# Patient Record
Sex: Female | Born: 1970 | Race: Asian | Hispanic: No | Marital: Married | Smoking: Never smoker
Health system: Southern US, Community
[De-identification: ages and names within clinical notes are randomized; demographics above are authoritative.]

## PROBLEM LIST (undated history)

## (undated) DIAGNOSIS — D649 Anemia, unspecified: Secondary | ICD-10-CM

---

## 1999-09-05 ENCOUNTER — Other Ambulatory Visit: Admission: RE | Admit: 1999-09-05 | Discharge: 1999-09-05 | Payer: Self-pay | Admitting: Gynecology

## 2000-02-03 ENCOUNTER — Encounter: Payer: Self-pay | Admitting: Obstetrics & Gynecology

## 2000-02-03 ENCOUNTER — Ambulatory Visit (HOSPITAL_COMMUNITY): Admission: RE | Admit: 2000-02-03 | Discharge: 2000-02-03 | Payer: Self-pay | Admitting: Obstetrics & Gynecology

## 2000-02-10 ENCOUNTER — Inpatient Hospital Stay (HOSPITAL_COMMUNITY): Admission: AD | Admit: 2000-02-10 | Discharge: 2000-02-12 | Payer: Self-pay | Admitting: Obstetrics and Gynecology

## 2000-02-13 ENCOUNTER — Encounter: Admission: RE | Admit: 2000-02-13 | Discharge: 2000-03-19 | Payer: Self-pay | Admitting: Obstetrics and Gynecology

## 2005-03-05 ENCOUNTER — Other Ambulatory Visit: Admission: RE | Admit: 2005-03-05 | Discharge: 2005-03-05 | Payer: Self-pay | Admitting: Obstetrics & Gynecology

## 2005-03-10 ENCOUNTER — Inpatient Hospital Stay (HOSPITAL_COMMUNITY): Admission: AD | Admit: 2005-03-10 | Discharge: 2005-03-10 | Payer: Self-pay | Admitting: Obstetrics & Gynecology

## 2005-04-02 ENCOUNTER — Ambulatory Visit: Payer: Self-pay | Admitting: Family Medicine

## 2005-04-02 ENCOUNTER — Inpatient Hospital Stay (HOSPITAL_COMMUNITY): Admission: AD | Admit: 2005-04-02 | Discharge: 2005-04-02 | Payer: Self-pay | Admitting: Obstetrics & Gynecology

## 2005-04-15 ENCOUNTER — Ambulatory Visit: Payer: Self-pay | Admitting: Obstetrics & Gynecology

## 2005-04-22 ENCOUNTER — Ambulatory Visit: Payer: Self-pay | Admitting: *Deleted

## 2005-05-13 ENCOUNTER — Ambulatory Visit: Payer: Self-pay | Admitting: *Deleted

## 2005-05-13 ENCOUNTER — Ambulatory Visit (HOSPITAL_COMMUNITY): Admission: RE | Admit: 2005-05-13 | Discharge: 2005-05-13 | Payer: Self-pay | Admitting: Obstetrics & Gynecology

## 2005-05-20 ENCOUNTER — Ambulatory Visit: Payer: Self-pay | Admitting: Obstetrics & Gynecology

## 2005-06-03 ENCOUNTER — Ambulatory Visit: Payer: Self-pay | Admitting: Obstetrics & Gynecology

## 2005-06-24 ENCOUNTER — Ambulatory Visit: Payer: Self-pay | Admitting: *Deleted

## 2005-07-01 ENCOUNTER — Ambulatory Visit: Payer: Self-pay | Admitting: Obstetrics & Gynecology

## 2005-07-02 ENCOUNTER — Observation Stay (HOSPITAL_COMMUNITY): Admission: AD | Admit: 2005-07-02 | Discharge: 2005-07-02 | Payer: Self-pay | Admitting: Obstetrics & Gynecology

## 2005-07-06 ENCOUNTER — Ambulatory Visit: Payer: Self-pay | Admitting: Cardiology

## 2005-07-08 ENCOUNTER — Ambulatory Visit: Payer: Self-pay | Admitting: Cardiology

## 2005-07-08 ENCOUNTER — Ambulatory Visit: Payer: Self-pay | Admitting: *Deleted

## 2005-07-15 ENCOUNTER — Ambulatory Visit: Payer: Self-pay | Admitting: *Deleted

## 2005-07-16 ENCOUNTER — Encounter (HOSPITAL_COMMUNITY): Admission: RE | Admit: 2005-07-16 | Discharge: 2005-08-15 | Payer: Self-pay | Admitting: Obstetrics & Gynecology

## 2005-07-18 ENCOUNTER — Inpatient Hospital Stay (HOSPITAL_COMMUNITY): Admission: AD | Admit: 2005-07-18 | Discharge: 2005-07-18 | Payer: Self-pay | Admitting: Obstetrics and Gynecology

## 2005-07-18 ENCOUNTER — Ambulatory Visit: Payer: Self-pay | Admitting: Family Medicine

## 2005-07-22 ENCOUNTER — Ambulatory Visit (HOSPITAL_COMMUNITY): Admission: RE | Admit: 2005-07-22 | Discharge: 2005-07-22 | Payer: Self-pay | Admitting: Obstetrics & Gynecology

## 2005-07-22 ENCOUNTER — Ambulatory Visit: Payer: Self-pay | Admitting: *Deleted

## 2005-08-02 ENCOUNTER — Inpatient Hospital Stay (HOSPITAL_COMMUNITY): Admission: AD | Admit: 2005-08-02 | Discharge: 2005-08-02 | Payer: Self-pay | Admitting: Family Medicine

## 2005-08-02 ENCOUNTER — Ambulatory Visit: Payer: Self-pay | Admitting: Family Medicine

## 2005-08-05 ENCOUNTER — Ambulatory Visit: Payer: Self-pay | Admitting: *Deleted

## 2005-08-13 ENCOUNTER — Ambulatory Visit: Payer: Self-pay | Admitting: Family Medicine

## 2005-08-14 ENCOUNTER — Ambulatory Visit: Payer: Self-pay | Admitting: Cardiology

## 2005-08-16 ENCOUNTER — Encounter (HOSPITAL_COMMUNITY): Admission: RE | Admit: 2005-08-16 | Discharge: 2005-09-15 | Payer: Self-pay | Admitting: Obstetrics & Gynecology

## 2005-08-19 ENCOUNTER — Ambulatory Visit: Payer: Self-pay | Admitting: Obstetrics & Gynecology

## 2005-09-02 ENCOUNTER — Ambulatory Visit: Payer: Self-pay | Admitting: *Deleted

## 2005-09-08 ENCOUNTER — Inpatient Hospital Stay (HOSPITAL_COMMUNITY): Admission: RE | Admit: 2005-09-08 | Discharge: 2005-09-08 | Payer: Self-pay | Admitting: *Deleted

## 2005-09-08 ENCOUNTER — Ambulatory Visit: Payer: Self-pay | Admitting: Family Medicine

## 2005-09-11 ENCOUNTER — Ambulatory Visit: Payer: Self-pay | Admitting: *Deleted

## 2005-09-16 ENCOUNTER — Ambulatory Visit: Payer: Self-pay | Admitting: Obstetrics & Gynecology

## 2005-09-17 ENCOUNTER — Observation Stay (HOSPITAL_COMMUNITY): Admission: AD | Admit: 2005-09-17 | Discharge: 2005-09-17 | Payer: Self-pay | Admitting: *Deleted

## 2005-09-18 ENCOUNTER — Ambulatory Visit: Payer: Self-pay | Admitting: *Deleted

## 2005-09-22 ENCOUNTER — Ambulatory Visit (HOSPITAL_COMMUNITY): Admission: RE | Admit: 2005-09-22 | Discharge: 2005-09-22 | Payer: Self-pay | Admitting: *Deleted

## 2005-09-23 ENCOUNTER — Ambulatory Visit: Payer: Self-pay | Admitting: Obstetrics & Gynecology

## 2005-09-23 ENCOUNTER — Ambulatory Visit: Payer: Self-pay | Admitting: *Deleted

## 2005-09-23 ENCOUNTER — Inpatient Hospital Stay (HOSPITAL_COMMUNITY): Admission: AD | Admit: 2005-09-23 | Discharge: 2005-09-26 | Payer: Self-pay | Admitting: Obstetrics and Gynecology

## 2006-12-29 IMAGING — US US OB FOLLOW-UP
1 series · 18 of 28 positions shown · non-contrast
Comparison: none

CLINICAL DATA: 28 week 2 day assigned gestational age.  Previous history of IUGR.  Evaluate fetal growth and amniotic fluid.

[Series 1: us ob re-eval · 18 of 52 slices shown]
[im 1/52]
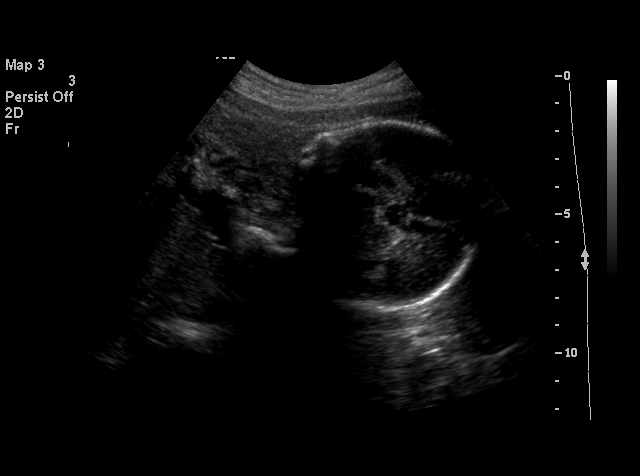
[im 4/52]
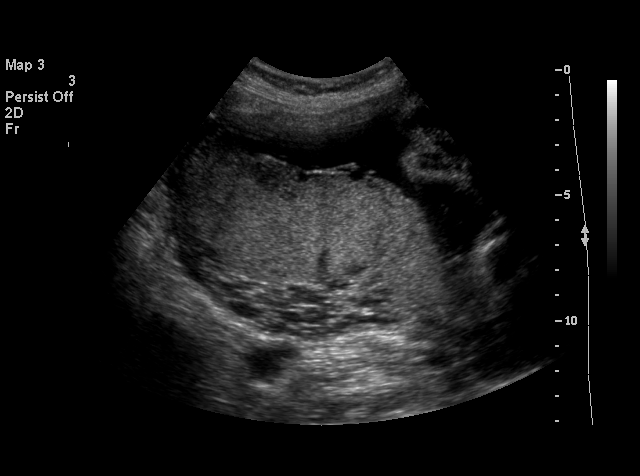
[im 6/52]
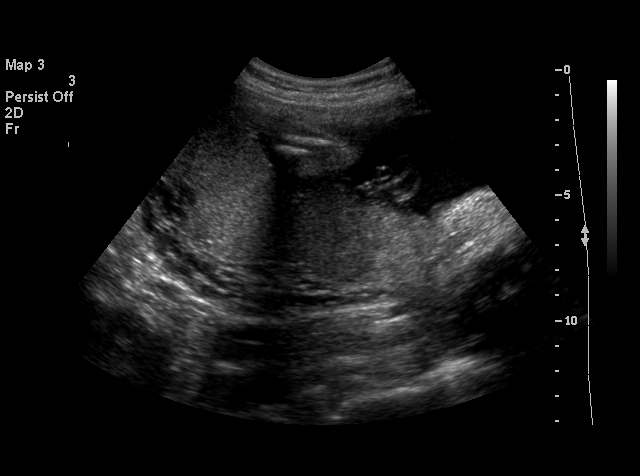
[im 10/52]
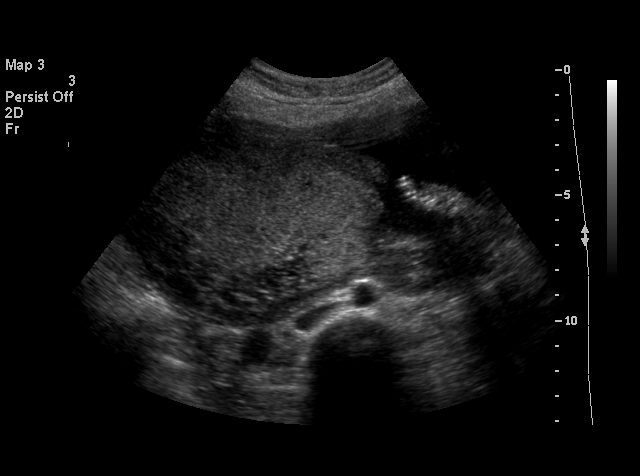
[im 14/52]
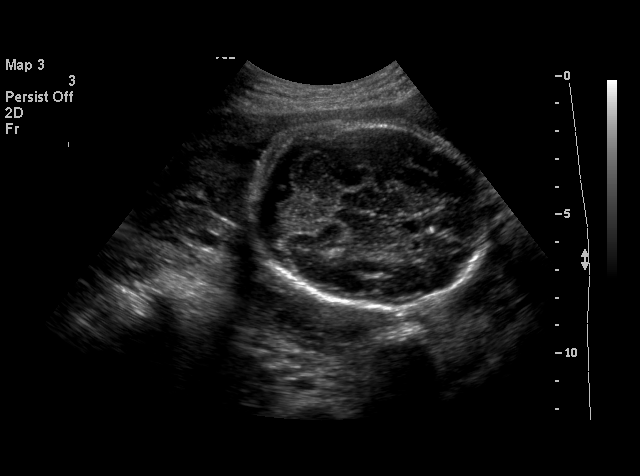
[im 16/52]
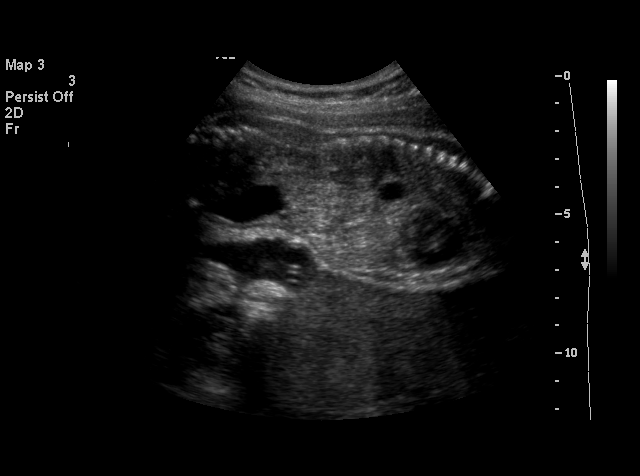
[im 19/52]
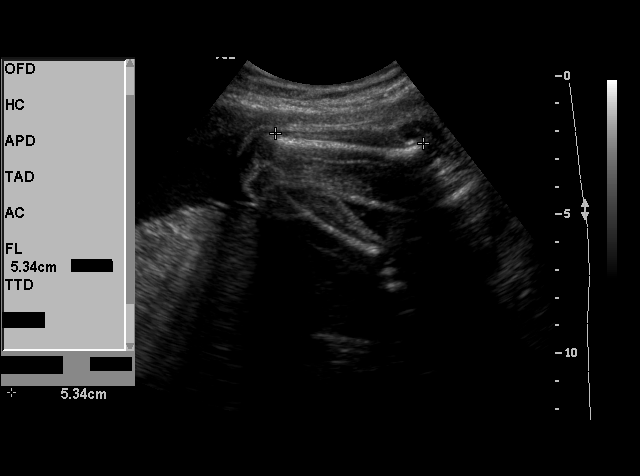
[im 21/52]
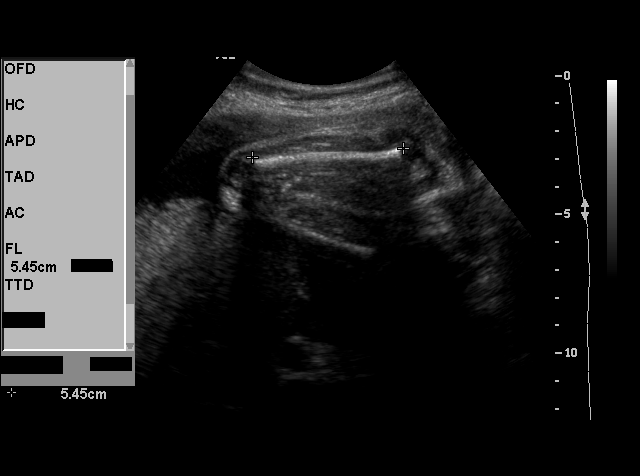
[im 25/52]
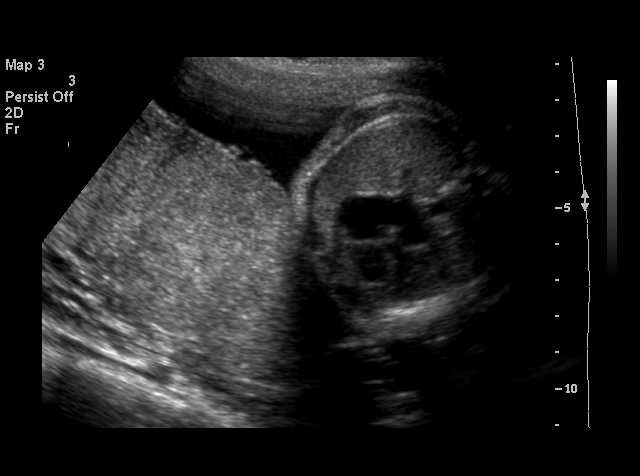
[im 27/52]
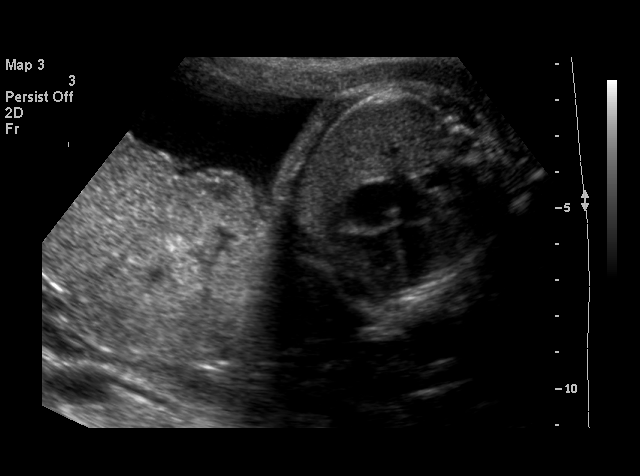
[im 31/52]
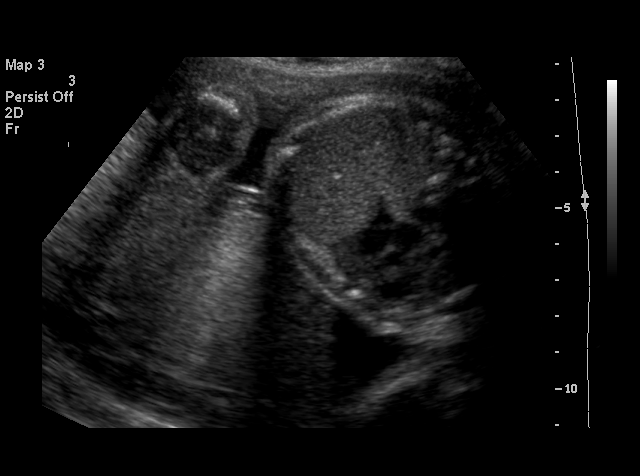
[im 33/52]
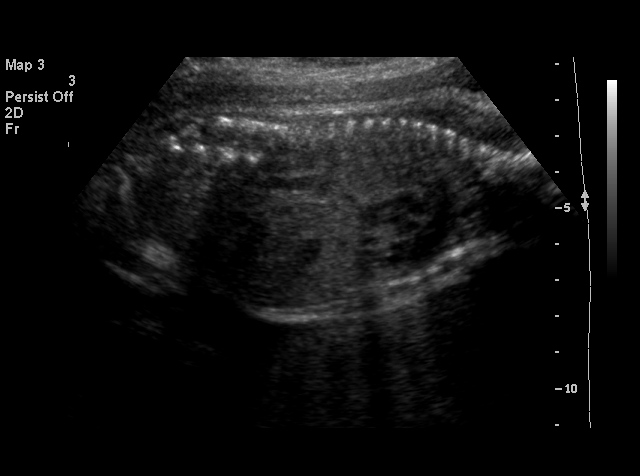
[im 36/52]
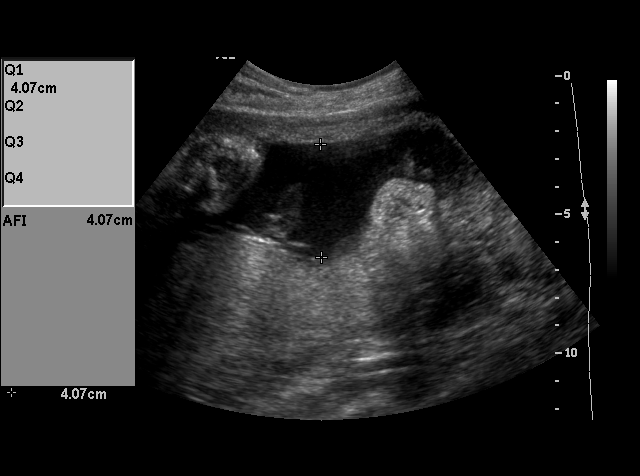
[im 40/52]
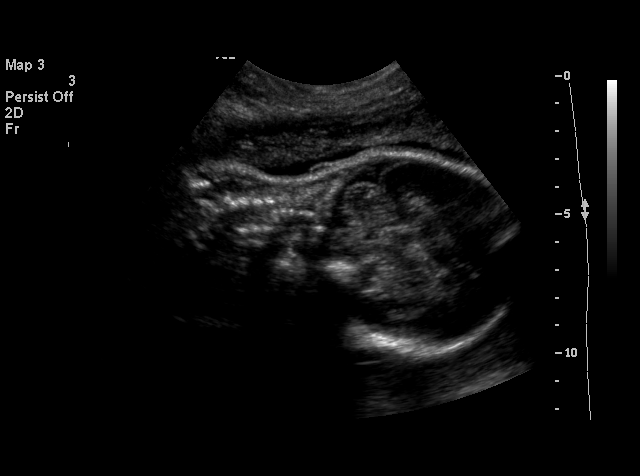
[im 42/52]
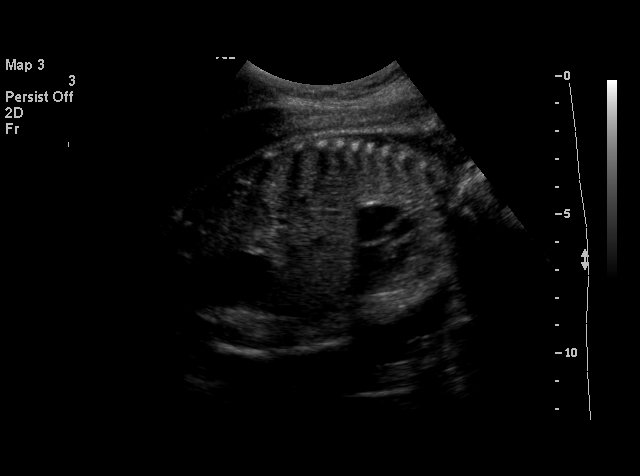
[im 46/52]
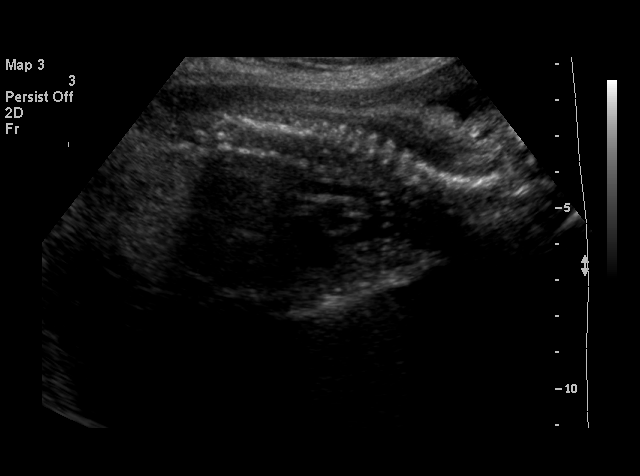
[im 48/52]
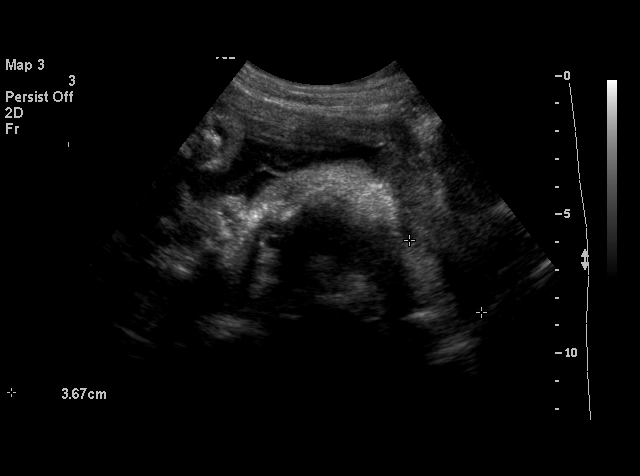
[im 52/52]
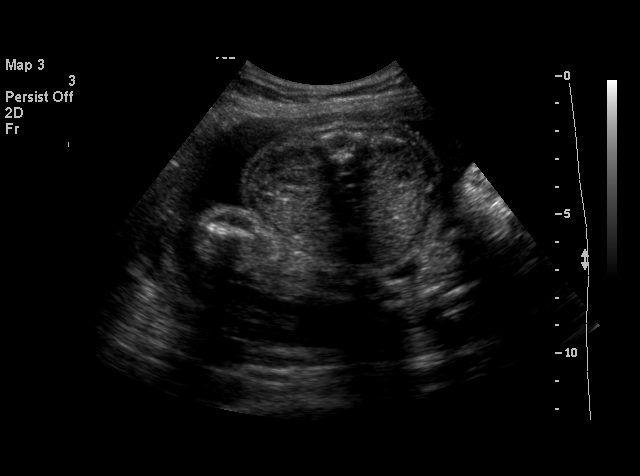

[18 of 28 positions shown; findings below may reference images not displayed]

OBSTETRICAL ULTRASOUND RE-EVALUATION:
Number of Fetuses:  1
Heart Rate:  146
Movement:  Yes
Breathing:  No
Presentation:  Cephalic
Placental Location:  Posterior, right lateral 
Grade:  I
Previa:  No
Amniotic Fluid (subjective):  Normal
Amniotic Fluid (objective):  14.5 cm AFI (5th -95th%ile = 9.4 ? 22.8 cm for 28 weeks)

FETAL BIOMETRY
BPD:  6.7 cm   27 w 0 d
HC:  25.8 cm  28 w 1 d
AC:  22.9 cm   27 w 3 d
FL:  5.3 cm   28 w 3 d

Mean GA:  27 w 6 d
Assigned GA:  28 w 2 d  Assigned EDC:  10/12/05

EFW:  1164 g (H) 50th ? 75th%ile (1110-1162g) for 28 weeks

FETAL ANATOMY
Lateral Ventricles:  Visualized   
Thalami/CSP:  Visualized 
Posterior Fossa:  Visualized 
Nuchal Region:  
Spine:  Previously seen 
4 Chamber Heart on Left:  Visualized 
Stomach on Left:  Visualized 
3 Vessel Cord:  Previously seen      
Cord Insertion Site:  Visualized  
Kidneys:  Visualized 
Bladder:  Visualized      
Extremities:  Previously seen 

ADDITIONAL ANATOMY VISUALIZED:  LVOT, RVOT, upper lip, diaphragm, ductal and aortic arches.
MATERNAL UTERINE AND ADNEXAL FINDINGS
Cervix:  2.9 cm Transabdominally
IMPRESSION: 1.  Assigned gestational age is currently 28 weeks 2 days.  Appropriate fetal growth, with EFW near the 75th percentile.
2.  [place] amniotic fluid volume, with AFI of 14.5 cm.

## 2012-06-03 ENCOUNTER — Encounter (HOSPITAL_COMMUNITY): Payer: Self-pay | Admitting: *Deleted

## 2012-06-03 ENCOUNTER — Emergency Department (HOSPITAL_COMMUNITY): Payer: Medicaid Other

## 2012-06-03 ENCOUNTER — Emergency Department (HOSPITAL_COMMUNITY)
Admission: EM | Admit: 2012-06-03 | Discharge: 2012-06-04 | Disposition: A | Discharge status: Home / Self Care | Payer: Medicaid Other | Attending: Emergency Medicine | Admitting: Emergency Medicine

## 2012-06-03 DIAGNOSIS — R109 Unspecified abdominal pain: Secondary | ICD-10-CM | POA: Insufficient documentation

## 2012-06-03 DIAGNOSIS — R112 Nausea with vomiting, unspecified: Secondary | ICD-10-CM

## 2012-06-03 HISTORY — DX: Anemia, unspecified: D64.9

## 2012-06-03 LAB — CBC WITH DIFFERENTIAL/PLATELET
Basophils Absolute: 0 10*3/uL (ref 0.0–0.1)
Eosinophils Absolute: 0 10*3/uL (ref 0.0–0.7)
Lymphocytes Relative: 22 % (ref 12–46)
MCH: 20.4 pg — ABNORMAL LOW (ref 26.0–34.0)
MCHC: 29.6 g/dL — ABNORMAL LOW (ref 30.0–36.0)
Monocytes Absolute: 0.3 10*3/uL (ref 0.1–1.0)
Neutro Abs: 4.4 10*3/uL (ref 1.7–7.7)
Neutrophils Relative %: 73 % (ref 43–77)
RDW: 18 % — ABNORMAL HIGH (ref 11.5–15.5)

## 2012-06-03 LAB — COMPREHENSIVE METABOLIC PANEL
ALT: 12 U/L (ref 0–35)
Calcium: 9.4 mg/dL (ref 8.4–10.5)
Creatinine, Ser: 0.46 mg/dL — ABNORMAL LOW (ref 0.50–1.10)
GFR calc Af Amer: >90 mL/min (ref >90)
GFR calc non Af Amer: >90 mL/min (ref >90)
Glucose, Bld: 111 mg/dL — ABNORMAL HIGH (ref 70–99)
Sodium: 135 mEq/L (ref 135–145)
Total Protein: 8 g/dL (ref 6.0–8.3)

## 2012-06-03 LAB — LIPASE, BLOOD: Lipase: 21 U/L (ref 11–59)

## 2012-06-03 LAB — POCT I-STAT, CHEM 8
Chloride: 104 mEq/L (ref 96–112)
Glucose, Bld: 113 mg/dL — ABNORMAL HIGH (ref 70–99)
HCT: 32 % — ABNORMAL LOW (ref 36.0–46.0)
Hemoglobin: 10.9 g/dL — ABNORMAL LOW (ref 12.0–15.0)
Potassium: 3.6 mEq/L (ref 3.5–5.1)
Sodium: 139 mEq/L (ref 135–145)

## 2012-06-03 MED ORDER — SODIUM CHLORIDE 0.9 % IV BOLUS (SEPSIS)
1000.0000 mL | Freq: Once | INTRAVENOUS | Status: AC
  Administered 2012-06-03: 1000 mL via INTRAVENOUS

## 2012-06-03 MED ORDER — ONDANSETRON HCL 4 MG/2ML IJ SOLN
4.0000 mg | Freq: Once | INTRAMUSCULAR | Status: AC
  Administered 2012-06-03: 4 mg via INTRAVENOUS
  Filled 2012-06-03: qty 2

## 2012-06-03 NOTE — ED Notes (Signed)
RUE:[id number]<JW> Expected date:<BR> Expected time:<BR> Means of arrival:<BR> Comments:<BR> Triage 1

## 2012-06-03 NOTE — ED Provider Notes (Signed)
History     CSN: [id number]  Arrival date & time 06/03/12  2225   First MD Initiated Contact with Patient 06/03/12 2259      Chief Complaint  Patient presents with  . Emesis    (Consider location/radiation/quality/duration/timing/severity/associated sxs/prior treatment) HPI Hx per PT. Sick since this am with N/V unable to hold anything down, multiple episodes. No diarrhea, no bloo din emesis or stools,. Last BM yesterday, no h/o ABD surgeries, no sick contacts at home. No F/C, no recent travel or known bad food exposures. No medications or medical problems. Mod in severity, has some mild diffuse ABD discomfort, no back pain.  Past Medical History  Diagnosis Date  . Anemia     History reviewed. No pertinent past surgical history.  History reviewed. No pertinent family history.  History  Substance Use Topics  . Smoking status: Never Smoker   . Smokeless tobacco: Not on file  . Alcohol Use: No    OB History    Grav Para Term Preterm Abortions TAB SAB Ect Mult Living                  Review of Systems  Constitutional: Negative for fever and chills.  HENT: Negative for neck pain and neck stiffness.   Eyes: Negative for pain.  Respiratory: Negative for shortness of breath.   Cardiovascular: Negative for chest pain.  Gastrointestinal: Positive for nausea and vomiting. Negative for diarrhea, constipation, blood in stool, abdominal distention and anal bleeding.  Genitourinary: Negative for dysuria.  Musculoskeletal: Negative for back pain.  Skin: Negative for rash.  Neurological: Negative for headaches.  All other systems reviewed and are negative.    Allergies  Review of patient's allergies indicates no known allergies.  Home Medications   Current Outpatient Rx  Name Route Sig Dispense Refill  . ACETAMINOPHEN 500 MG PO TABS Oral Take 500 mg by mouth every 6 (six) hours as needed. Pain    . FERROUS SULFATE 325 (65 FE) MG PO TABS Oral Take 325 mg by mouth daily with  breakfast.      BP 116/67  Pulse 74  Temp 98.3 F (36.8 C) (Oral)  Resp 16  Wt 117 lb (53.071 kg)  SpO2 100%  LMP 06/02/2012  Physical Exam  Constitutional: She is oriented to person, place, and time. She appears well-developed and well-nourished.  HENT:  Head: Normocephalic and atraumatic.  Eyes: Conjunctivae and EOM are normal. Pupils are equal, round, and reactive to light.  Neck: Trachea normal. Neck supple. No thyromegaly present.  Cardiovascular: Normal rate, regular rhythm, S1 normal, S2 normal and normal pulses.     No systolic murmur is present   No diastolic murmur is present  Pulses:      Radial pulses are 2+ on the right side, and 2+ on the left side.  Pulmonary/Chest: Effort normal and breath sounds normal. She has no wheezes. She has no rhonchi. She has no rales. She exhibits no tenderness.  Abdominal: Soft. Normal appearance and bowel sounds are normal. She exhibits no mass. There is no rebound, no guarding, no CVA tenderness and negative Murphy's sign.       Soft throughout with mild diffuse tenderness. No acute ABD  Musculoskeletal:       BLE:s Calves nontender, no cords or erythema, negative Homans sign  Neurological: She is alert and oriented to person, place, and time. She has normal strength. No cranial nerve deficit or sensory deficit. GCS eye subscore is 4. GCS verbal subscore  is 5. GCS motor subscore is 6.  Skin: Skin is warm and dry. No rash noted. She is not diaphoretic.  Psychiatric: Her speech is normal.       Cooperative and appropriate    ED Course  Procedures (including critical care time)  Results for orders placed during the hospital encounter of 06/03/12  CBC WITH DIFFERENTIAL      Component Value Range   WBC 6.0  4.0 - 10.5 K/uL   RBC 4.50  3.87 - 5.11 MIL/uL   Hemoglobin 9.2 (*) 12.0 - 15.0 g/dL   HCT 16.1 (*) 09.6 - 04.5 %   MCV 69.1 (*) 78.0 - 100.0 fL   MCH 20.4 (*) 26.0 - 34.0 pg   MCHC 29.6 (*) 30.0 - 36.0 g/dL   RDW 40.9 (*)  81.1 - 15.5 %   Platelets 325  150 - 400 K/uL   Neutrophils Relative 73  43 - 77 %   Lymphocytes Relative 22  12 - 46 %   Monocytes Relative 5  3 - 12 %   Eosinophils Relative 0  0 - 5 %   Basophils Relative 0  0 - 1 %   Neutro Abs 4.4  1.7 - 7.7 K/uL   Lymphs Abs 1.3  0.7 - 4.0 K/uL   Monocytes Absolute 0.3  0.1 - 1.0 K/uL   Eosinophils Absolute 0.0  0.0 - 0.7 K/uL   Basophils Absolute 0.0  0.0 - 0.1 K/uL   Smear Review MORPHOLOGY UNREMARKABLE    PREGNANCY, URINE      Component Value Range   Preg Test, Ur NEGATIVE  NEGATIVE  COMPREHENSIVE METABOLIC PANEL      Component Value Range   Sodium 135  135 - 145 mEq/L   Potassium 3.6  3.5 - 5.1 mEq/L   Chloride 99  96 - 112 mEq/L   CO2 24  19 - 32 mEq/L   Glucose, Bld 111 (*) 70 - 99 mg/dL   BUN 9  6 - 23 mg/dL   Creatinine, Ser 9.14 (*) 0.50 - 1.10 mg/dL   Calcium 9.4  8.4 - 78.2 mg/dL   Total Protein 8.0  6.0 - 8.3 g/dL   Albumin 4.2  3.5 - 5.2 g/dL   AST 16  0 - 37 U/L   ALT 12  0 - 35 U/L   Alkaline Phosphatase 83  39 - 117 U/L   Total Bilirubin 0.3  0.3 - 1.2 mg/dL   GFR calc non Af Amer >90  >90 mL/min   GFR calc Af Amer >90  >90 mL/min  LIPASE, BLOOD      Component Value Range   Lipase 21  11 - 59 U/L  POCT I-STAT, CHEM 8      Component Value Range   Sodium 139  135 - 145 mEq/L   Potassium 3.6  3.5 - 5.1 mEq/L   Chloride 104  96 - 112 mEq/L   BUN 8  6 - 23 mg/dL   Creatinine, Ser 9.56  0.50 - 1.10 mg/dL   Glucose, Bld 213 (*) 70 - 99 mg/dL   Calcium, Ion 0.86 (*) 1.12 - 1.23 mmol/L   TCO2 22  0 - 100 mmol/L   Hemoglobin 10.9 (*) 12.0 - 15.0 g/dL   HCT 57.8 (*) 46.9 - 62.9 %   Dg Abd Acute W/chest  06/04/2012  *RADIOLOGY REPORT*  Clinical Data: Abdominal pain.  Vomiting.  ACUTE ABDOMEN SERIES (ABDOMEN 2 VIEW & CHEST 1 VIEW)  Comparison:  None.  Findings:  There is no evidence of dilated bowel loops or free intraperitoneal air.  No radiopaque calculi or other significant radiographic abnormality is seen. Heart size and  mediastinal contours are within normal limits.  Both lungs are clear.  IMPRESSION: Negative abdominal radiographs.  No acute cardiopulmonary disease.   Original Report Authenticated By: Danae Orleans, M.D.     IVFs, IV zofran  3:30 AM feeling better, tolerating POs, no tenderness on ABD exam, no peritonitis, requesting to be discharged home, feeling much better.    MDM    VS and nursing notes reviewed. IVFs, zofran, labs and imaging as above. RX and precautions provided - verbalized as understood.         Sunnie Nielsen, MD 06/04/12 [phone]

## 2012-06-03 NOTE — ED Notes (Signed)
Pt cannot urinate. 

## 2012-06-03 NOTE — ED Notes (Signed)
Pt c/o vomiting since this morning more than 20 times. Pt unable to keep water down.

## 2012-06-04 MED ORDER — ONDANSETRON HCL 4 MG PO TABS: 4.0000 mg | ORAL_TABLET | Freq: Four times a day (QID) | ORAL | Status: AC

## 2012-06-04 MED ORDER — PROMETHAZINE HCL 25 MG/ML IJ SOLN
25.0000 mg | Freq: Once | INTRAMUSCULAR | Status: AC
  Administered 2012-06-04: 25 mg via INTRAMUSCULAR
  Filled 2012-06-04: qty 1

## 2012-06-04 NOTE — ED Notes (Signed)
Pt not able to urinate.   

## 2021-01-09 ENCOUNTER — Other Ambulatory Visit: Payer: Self-pay | Admitting: Internal Medicine

## 2021-01-09 DIAGNOSIS — N920 Excessive and frequent menstruation with regular cycle: Secondary | ICD-10-CM

## 2021-01-27 ENCOUNTER — Ambulatory Visit
Admission: RE | Admit: 2021-01-27 | Discharge: 2021-01-27 | Disposition: A | Discharge status: Home / Self Care | Payer: No Typology Code available for payment source | Source: Ambulatory Visit | Attending: Internal Medicine | Admitting: Internal Medicine

## 2021-01-27 DIAGNOSIS — N920 Excessive and frequent menstruation with regular cycle: Secondary | ICD-10-CM
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
## 2021-06-18 ENCOUNTER — Other Ambulatory Visit: Payer: Self-pay

## 2021-06-18 ENCOUNTER — Encounter (HOSPITAL_BASED_OUTPATIENT_CLINIC_OR_DEPARTMENT_OTHER): Payer: Self-pay | Admitting: Obstetrics and Gynecology

## 2021-06-22 DIAGNOSIS — N939 Abnormal uterine and vaginal bleeding, unspecified: Secondary | ICD-10-CM

## 2021-06-22 DIAGNOSIS — D509 Iron deficiency anemia, unspecified: Secondary | ICD-10-CM

## 2021-06-22 DIAGNOSIS — N840 Polyp of corpus uteri: Secondary | ICD-10-CM

## 2021-06-24 ENCOUNTER — Encounter (HOSPITAL_BASED_OUTPATIENT_CLINIC_OR_DEPARTMENT_OTHER)
Admission: RE | Admit: 2021-06-24 | Discharge: 2021-06-24 | Disposition: A | Discharge status: Home / Self Care | Payer: PRIVATE HEALTH INSURANCE | Source: Ambulatory Visit | Attending: Obstetrics and Gynecology | Admitting: Obstetrics and Gynecology

## 2021-06-24 DIAGNOSIS — N939 Abnormal uterine and vaginal bleeding, unspecified: Secondary | ICD-10-CM | POA: Insufficient documentation

## 2021-06-24 DIAGNOSIS — N841 Polyp of cervix uteri: Secondary | ICD-10-CM | POA: Diagnosis not present

## 2021-06-24 DIAGNOSIS — D500 Iron deficiency anemia secondary to blood loss (chronic): Secondary | ICD-10-CM | POA: Diagnosis not present

## 2021-06-24 DIAGNOSIS — Z01818 Encounter for other preprocedural examination: Secondary | ICD-10-CM | POA: Insufficient documentation

## 2021-06-24 LAB — TYPE AND SCREEN
ABO/RH(D): B POS
Antibody Screen: NEGATIVE

## 2021-06-24 LAB — CBC
HCT: 32.9 % — ABNORMAL LOW (ref 36.0–46.0)
Hemoglobin: 9.8 g/dL — ABNORMAL LOW (ref 12.0–15.0)
MCH: 21.6 pg — ABNORMAL LOW (ref 26.0–34.0)
MCHC: 29.8 g/dL — ABNORMAL LOW (ref 30.0–36.0)
MCV: 72.5 fL — ABNORMAL LOW (ref 80.0–100.0)
Platelets: 252 10*3/uL (ref 150–400)
RBC: 4.54 MIL/uL (ref 3.87–5.11)
RDW: 16.8 % — ABNORMAL HIGH (ref 11.5–15.5)
WBC: 5 10*3/uL (ref 4.0–10.5)
nRBC: 0 % (ref 0.0–0.2)

## 2021-06-24 NOTE — Progress Notes (Signed)

## 2021-06-24 NOTE — Progress Notes (Signed)
Sent text reminding pt to come for lab work today.  

## 2021-06-25 ENCOUNTER — Ambulatory Visit (HOSPITAL_BASED_OUTPATIENT_CLINIC_OR_DEPARTMENT_OTHER): Payer: PRIVATE HEALTH INSURANCE | Admitting: Anesthesiology

## 2021-06-25 ENCOUNTER — Ambulatory Visit (HOSPITAL_BASED_OUTPATIENT_CLINIC_OR_DEPARTMENT_OTHER)
Admission: RE | Admit: 2021-06-25 | Discharge: 2021-06-25 | Disposition: A | Discharge status: Home / Self Care | Payer: PRIVATE HEALTH INSURANCE | Attending: Obstetrics and Gynecology | Admitting: Obstetrics and Gynecology

## 2021-06-25 ENCOUNTER — Encounter (HOSPITAL_BASED_OUTPATIENT_CLINIC_OR_DEPARTMENT_OTHER)
Admission: RE | Disposition: A | Discharge status: Home / Self Care | Payer: Self-pay | Source: Home / Self Care | Attending: Obstetrics and Gynecology

## 2021-06-25 ENCOUNTER — Encounter (HOSPITAL_BASED_OUTPATIENT_CLINIC_OR_DEPARTMENT_OTHER): Payer: Self-pay | Admitting: Obstetrics and Gynecology

## 2021-06-25 ENCOUNTER — Other Ambulatory Visit: Payer: Self-pay

## 2021-06-25 DIAGNOSIS — D509 Iron deficiency anemia, unspecified: Secondary | ICD-10-CM

## 2021-06-25 DIAGNOSIS — D500 Iron deficiency anemia secondary to blood loss (chronic): Secondary | ICD-10-CM | POA: Insufficient documentation

## 2021-06-25 DIAGNOSIS — N939 Abnormal uterine and vaginal bleeding, unspecified: Secondary | ICD-10-CM | POA: Diagnosis not present

## 2021-06-25 DIAGNOSIS — N840 Polyp of corpus uteri: Secondary | ICD-10-CM

## 2021-06-25 DIAGNOSIS — N841 Polyp of cervix uteri: Secondary | ICD-10-CM | POA: Insufficient documentation

## 2021-06-25 HISTORY — PX: HYSTEROSCOPY WITH D & C: SHX1775

## 2021-06-25 HISTORY — PX: HYSTEROSCOPY: SHX211

## 2021-06-25 LAB — POCT PREGNANCY, URINE: Preg Test, Ur: NEGATIVE

## 2021-06-25 SURGERY — DILATATION AND CURETTAGE /HYSTEROSCOPY
Anesthesia: General | Site: Uterus

## 2021-06-25 MED ORDER — OXYCODONE HCL 5 MG PO TABS: 5.0000 mg | ORAL_TABLET | Freq: Four times a day (QID) | ORAL | 0 refills | Status: DC | PRN

## 2021-06-25 MED ORDER — SODIUM CHLORIDE 0.9 % IR SOLN
Status: DC | PRN
  Administered 2021-06-25 (×2): 3000 mL

## 2021-06-25 MED ORDER — DEXAMETHASONE SODIUM PHOSPHATE 4 MG/ML IJ SOLN
INTRAMUSCULAR | Status: DC | PRN
  Administered 2021-06-25: 10 mg via INTRAVENOUS

## 2021-06-25 MED ORDER — FENTANYL CITRATE (PF) 100 MCG/2ML IJ SOLN: 25.0000 ug | INTRAMUSCULAR | Status: DC | PRN

## 2021-06-25 MED ORDER — OXYCODONE HCL 5 MG PO TABS
ORAL_TABLET | ORAL | Status: AC
  Filled 2021-06-25: qty 1

## 2021-06-25 MED ORDER — DROPERIDOL 2.5 MG/ML IJ SOLN: 0.6250 mg | Freq: Once | INTRAMUSCULAR | Status: DC | PRN

## 2021-06-25 MED ORDER — ONDANSETRON HCL 4 MG/2ML IJ SOLN: 4.0000 mg | Freq: Once | INTRAMUSCULAR | Status: DC | PRN

## 2021-06-25 MED ORDER — KETOROLAC TROMETHAMINE 30 MG/ML IJ SOLN
INTRAMUSCULAR | Status: AC
  Filled 2021-06-25: qty 1

## 2021-06-25 MED ORDER — LACTATED RINGERS IV SOLN: INTRAVENOUS | Status: DC

## 2021-06-25 MED ORDER — SILVER NITRATE-POT NITRATE 75-25 % EX MISC
CUTANEOUS | Status: DC | PRN
  Administered 2021-06-25: 1
  Administered 2021-06-25: 2

## 2021-06-25 MED ORDER — LIDOCAINE HCL (CARDIAC) PF 100 MG/5ML IV SOSY
PREFILLED_SYRINGE | INTRAVENOUS | Status: DC | PRN
  Administered 2021-06-25: 40 mg via INTRAVENOUS

## 2021-06-25 MED ORDER — OXYCODONE HCL 5 MG/5ML PO SOLN
5.0000 mg | Freq: Once | ORAL | Status: AC | PRN
Start: 2021-06-25 — End: 2021-06-25

## 2021-06-25 MED ORDER — FENTANYL CITRATE (PF) 100 MCG/2ML IJ SOLN
INTRAMUSCULAR | Status: DC | PRN
  Administered 2021-06-25 (×2): 25 ug via INTRAVENOUS
  Administered 2021-06-25: 50 ug via INTRAVENOUS

## 2021-06-25 MED ORDER — OXYCODONE HCL 5 MG PO TABS
5.0000 mg | ORAL_TABLET | Freq: Once | ORAL | Status: AC | PRN
  Administered 2021-06-25: 5 mg via ORAL

## 2021-06-25 MED ORDER — PROPOFOL 10 MG/ML IV BOLUS
INTRAVENOUS | Status: AC
  Filled 2021-06-25: qty 20

## 2021-06-25 MED ORDER — IBUPROFEN 800 MG PO TABS: 800.0000 mg | ORAL_TABLET | Freq: Three times a day (TID) | ORAL | 0 refills | Status: DC | PRN

## 2021-06-25 MED ORDER — FENTANYL CITRATE (PF) 100 MCG/2ML IJ SOLN
INTRAMUSCULAR | Status: AC
  Filled 2021-06-25: qty 2

## 2021-06-25 MED ORDER — ONDANSETRON HCL 4 MG/2ML IJ SOLN
INTRAMUSCULAR | Status: DC | PRN
  Administered 2021-06-25: 4 mg via INTRAVENOUS

## 2021-06-25 MED ORDER — DEXAMETHASONE SODIUM PHOSPHATE 10 MG/ML IJ SOLN
INTRAMUSCULAR | Status: AC
  Filled 2021-06-25: qty 1

## 2021-06-25 MED ORDER — PROPOFOL 10 MG/ML IV BOLUS
INTRAVENOUS | Status: DC | PRN
  Administered 2021-06-25: 30 mg via INTRAVENOUS
  Administered 2021-06-25: 120 mg via INTRAVENOUS

## 2021-06-25 MED ORDER — ONDANSETRON HCL 4 MG/2ML IJ SOLN
INTRAMUSCULAR | Status: AC
  Filled 2021-06-25: qty 2

## 2021-06-25 MED ORDER — KETOROLAC TROMETHAMINE 30 MG/ML IJ SOLN
INTRAMUSCULAR | Status: DC | PRN
  Administered 2021-06-25: 30 mg via INTRAVENOUS

## 2021-06-25 MED ORDER — MIDAZOLAM HCL 5 MG/5ML IJ SOLN
INTRAMUSCULAR | Status: DC | PRN
  Administered 2021-06-25: 2 mg via INTRAVENOUS

## 2021-06-25 MED ORDER — MIDAZOLAM HCL 2 MG/2ML IJ SOLN
INTRAMUSCULAR | Status: AC
  Filled 2021-06-25: qty 2

## 2021-06-25 SURGICAL SUPPLY — 23 items
CANISTER SUCT 1200ML W/VALVE (MISCELLANEOUS) ×3 IMPLANT
CATH ROBINSON RED A/P 16FR (CATHETERS) ×1 IMPLANT
DEVICE MYOSURE LITE (MISCELLANEOUS) IMPLANT
DEVICE MYOSURE REACH (MISCELLANEOUS) IMPLANT
DILATOR CANAL MILEX (MISCELLANEOUS) IMPLANT
ELECT REM PT RETURN 9FT ADLT (ELECTROSURGICAL)
ELECTRODE REM PT RTRN 9FT ADLT (ELECTROSURGICAL) IMPLANT
GAUZE 4X4 16PLY ~~LOC~~+RFID DBL (SPONGE) ×3 IMPLANT
GLOVE SURG ENC MOIS LTX SZ6.5 (GLOVE) ×6 IMPLANT
GLOVE SURG ENC TEXT LTX SZ6.5 (GLOVE) ×3 IMPLANT
GLOVE SURG UNDER POLY LF SZ6.5 (GLOVE) ×3 IMPLANT
GLOVE SURG UNDER POLY LF SZ7 (GLOVE) ×5 IMPLANT
GOWN STRL REUS W/ TWL LRG LVL3 (GOWN DISPOSABLE) ×5 IMPLANT
GOWN STRL REUS W/TWL LRG LVL3 (GOWN DISPOSABLE) ×9
KIT PROCEDURE FLUENT (KITS) ×3 IMPLANT
KIT TURNOVER KIT B (KITS) ×3 IMPLANT
PACK VAGINAL MINOR WOMEN LF (CUSTOM PROCEDURE TRAY) ×3 IMPLANT
PAD OB MATERNITY 4.3X12.25 (PERSONAL CARE ITEMS) ×3 IMPLANT
SEAL ROD LENS SCOPE MYOSURE (ABLATOR) ×3 IMPLANT
SET GENESYS HTA PROCERVA (MISCELLANEOUS) ×2 IMPLANT
SLEEVE SCD COMPRESS KNEE MED (STOCKING) ×3 IMPLANT
TOWEL GREEN STERILE FF (TOWEL DISPOSABLE) ×6 IMPLANT
UNDERPAD 30X36 HEAVY ABSORB (UNDERPADS AND DIAPERS) ×3 IMPLANT

## 2021-06-25 NOTE — Transfer of Care (Signed)
Immediate Anesthesia Transfer of Care Note  Patient: Diana Patrick  Procedure(s) Performed: DILATATION & CURETTAGE/HYSTEROSCOPY WITH  POLPYECTOMY (Uterus) HYDROTHERMAL ABLATION (Uterus)  Patient Location: PACU  Anesthesia Type:General  Level of Consciousness: sedated  Airway & Oxygen Therapy: Patient Spontanous Breathing and Patient connected to face mask oxygen  Post-op Assessment: Report given to RN and Post -op Vital signs reviewed and stable  Post vital signs: Reviewed and stable  Last Vitals:  Vitals Value Taken Time  BP    Temp    Pulse 70 06/25/21 1550  Resp    SpO2 100 % 06/25/21 1550  Vitals shown include unvalidated device data.  Last Pain:  Vitals:   06/25/21 1151  TempSrc: Oral  PainSc: 0-No pain      Patients Stated Pain Goal: 8 (06/25/21 1151)  Complications: No notable events documented.

## 2021-06-25 NOTE — Interval H&P Note (Signed)
History and Physical Interval Note:  06/25/2021 1:09 PM  Diana Patrick  has presented today for surgery, with the diagnosis of Endometrial Polyp.  The various methods of treatment have been discussed with the patient and family. After consideration of risks, benefits and other options for treatment, the patient has consented to  Procedure(s): DILATATION & CURETTAGE/HYSTEROSCOPY WITH MYOSURE, POLPYECTOMY (N/A) HYDROTHERMAL ABLATION (N/A) as a surgical intervention. Patient understands danger of pregnancy after an endometrial ablation. Desires to use condoms only for contraception. The patient's history has been reviewed, patient examined, no change in status, stable for surgery.  I have reviewed the patient's chart and labs.  Questions were answered to the patient's satisfaction.     Steva Ready

## 2021-06-25 NOTE — Discharge Instructions (Signed)
Motrin/Ibuprofen can be taken at 930pm tonight if needed.   Post Anesthesia Home Care Instructions  Activity: Get plenty of rest for the remainder of the day. A responsible individual must stay with you for 24 hours following the procedure.  For the next 24 hours, DO NOT: -Drive a car -Advertising copywriter -Drink alcoholic beverages -Take any medication unless instructed by your physician -Make any legal decisions or sign important papers.  Meals: Start with liquid foods such as gelatin or soup. Progress to regular foods as tolerated. Avoid greasy, spicy, heavy foods. If nausea and/or vomiting occur, drink only clear liquids until the nausea and/or vomiting subsides. Call your physician if vomiting continues.  Special Instructions/Symptoms: Your throat may feel dry or sore from the anesthesia or the breathing tube placed in your throat during surgery. If this causes discomfort, gargle with warm salt water. The discomfort should disappear within 24 hours.  If you had a scopolamine patch placed behind your ear for the management of post- operative nausea and/or vomiting:  1. The medication in the patch is effective for 72 hours, after which it should be removed.  Wrap patch in a tissue and discard in the trash. Wash hands thoroughly with soap and water. 2. You may remove the patch earlier than 72 hours if you experience unpleasant side effects which may include dry mouth, dizziness or visual disturbances. 3. Avoid touching the patch. Wash your hands with soap and water after contact with the patch.

## 2021-06-25 NOTE — Anesthesia Preprocedure Evaluation (Signed)
Anesthesia Evaluation  Patient identified by MRN, date of birth, ID band Patient awake    Reviewed: Allergy & Precautions, NPO status , Patient's Chart, lab work & pertinent test results  Airway Mallampati: II  TM Distance: >3 FB Neck ROM: Full    Dental no notable dental hx. (+) Teeth Intact, Dental Advisory Given   Pulmonary neg pulmonary ROS,    Pulmonary exam normal breath sounds clear to auscultation       Cardiovascular negative cardio ROS Normal cardiovascular exam Rhythm:Regular Rate:Normal     Neuro/Psych negative neurological ROS  negative psych ROS   GI/Hepatic negative GI ROS, Neg liver ROS,   Endo/Other  negative endocrine ROS  Renal/GU negative Renal ROS  negative genitourinary   Musculoskeletal negative musculoskeletal ROS (+)   Abdominal   Peds  Hematology  (+) anemia ,   Anesthesia Other Findings   Reproductive/Obstetrics Endometrial polyp AUB                             Anesthesia Physical Anesthesia Plan  ASA: 2  Anesthesia Plan: General   Post-op Pain Management:    Induction: Intravenous  PONV Risk Score and Plan: Midazolam, Treatment may vary due to age or medical condition, Ondansetron and Dexamethasone  Airway Management Planned: LMA  Additional Equipment:   Intra-op Plan:   Post-operative Plan: Extubation in OR  Informed Consent: I have reviewed the patients History and Physical, chart, labs and discussed the procedure including the risks, benefits and alternatives for the proposed anesthesia with the patient or authorized representative who has indicated his/her understanding and acceptance.     Dental advisory given  Plan Discussed with: CRNA and Anesthesiologist  Anesthesia Plan Comments:         Anesthesia Quick Evaluation

## 2021-06-25 NOTE — Anesthesia Postprocedure Evaluation (Signed)
Anesthesia Post Note  Patient: Shrita Thien  Procedure(s) Performed: DILATATION & CURETTAGE/HYSTEROSCOPY WITH  POLPYECTOMY (Uterus) HYDROTHERMAL ABLATION (Uterus)     Patient location during evaluation: PACU Anesthesia Type: General Level of consciousness: awake and alert and oriented Pain management: pain level controlled Vital Signs Assessment: post-procedure vital signs reviewed and stable Respiratory status: spontaneous breathing, nonlabored ventilation and respiratory function stable Cardiovascular status: blood pressure returned to baseline and stable Postop Assessment: no apparent nausea or vomiting Anesthetic complications: no   No notable events documented.  Last Vitals:  Vitals:   06/25/21 1555 06/25/21 1556  BP: (!) 148/85   Pulse: 61 72  Resp: 16 18  Temp:  (!) 36.4 C  SpO2: 100% 99%    Last Pain:  Vitals:   06/25/21 1555  TempSrc:   PainSc: 0-No pain                 Ike Maragh A.

## 2021-06-25 NOTE — Op Note (Signed)
Pre Op Dx:   1. Abnormal uterine bleeding 2. Endometrial polyp on EMB 3. Iron deficiency anemia due to chronic blood loss  Post Op Dx:   1. Abnormal uterine bleeding 2. Cervical polyp 3. Iron deficiency anemia due to chronic blood loss  Procedure:   1. Hysteroscopy with Dilation and Curettage 2. Hydrothermal Ablation   Surgeon:  Dr. Steva Ready Assistants:  None Anesthesia:  General   EBL:  Minimal  IVF:  See anesthesia documentation UOP:  See documentation Fluid Deficit:  100cc   Drains:  None Specimen removed:  Endometrial curettings and cervical polyp - sent to pathology Device(s) implanted: None Case Type:  Clean-contaminated Findings:  Normal-appearing cervix. Endometrium normal-appearing with bilateral tubal ostia visualized. Small cervical polyp present. Complications: None Indications:  50 y.o. G2P2 with abnormal uterine bleeding and iron deficiency anemia due to chronic blood loss who desired hydrothermal ablation for management of bleeding.  Description of each procedure: After informed consent was obtained, the patient was taken to the operating room in the dorsal supine position. After administration of general endotracheal anesthesia, the patient was placed in the dorsal lithotomy position and was prepped and draped in the usual sterile fashion. The anterior lip of the cervix was grasped with a single-tooth tenaculum   The uterus sounded to 7cm. The cervix was serially dilated to accommodate the hysteroscope. The findings as above were noted. A gentle endometrial and cervical curettage performed prior to hydrothermal ablation. The findings as above were noted. The hydrothermal hysteroscope was then advanced into the uterus. Prior to hydrothermal ablation, adequate seal and absence of uterine perforation was noted. The hydrothermal ablation was performed for 10 minutes and adequate cool down performed at the end of the procedure. The tenaculum sites were treated with  silver nitrate.  Excellent hemostasis noted. All sponge and needle counts were correct x 2.  The patient was awakened, extubated, and appeared to have tolerated the procedure well. Disposition:  PACU  Steva Ready, DO

## 2021-06-25 NOTE — Anesthesia Procedure Notes (Signed)
Procedure Name: LMA Insertion Date/Time: 06/25/2021 2:46 PM Performed by: Ronnette Hila, CRNA Pre-anesthesia Checklist: Patient identified, Emergency Drugs available, Suction available and Patient being monitored Patient Re-evaluated:Patient Re-evaluated prior to induction Oxygen Delivery Method: Circle system utilized Preoxygenation: Pre-oxygenation with 100% oxygen Induction Type: IV induction Ventilation: Mask ventilation without difficulty LMA: LMA inserted LMA Size: 3.0 Number of attempts: 1 Airway Equipment and Method: Bite block Placement Confirmation: positive ETCO2 Tube secured with: Tape Dental Injury: Teeth and Oropharynx as per pre-operative assessment

## 2021-06-26 ENCOUNTER — Encounter (HOSPITAL_BASED_OUTPATIENT_CLINIC_OR_DEPARTMENT_OTHER): Payer: Self-pay | Admitting: Obstetrics and Gynecology

## 2021-06-27 LAB — SURGICAL PATHOLOGY

## 2021-06-27 NOTE — Addendum Note (Signed)
Addendum  created 06/27/21 0953 by Alford Highland, CRNA   Charge Capture section accepted

## 2022-07-06 IMAGING — US US PELVIS COMPLETE WITH TRANSVAGINAL
1 series · 13 of 25 positions shown · non-contrast
Comparison: None

CLINICAL DATA: Menorrhagia with regular cycles

EXAM:
TRANSABDOMINAL AND TRANSVAGINAL ULTRASOUND OF PELVIS
TECHNIQUE: Both transabdominal and transvaginal ultrasound examinations of the
pelvis were performed. Transabdominal technique was performed for
global imaging of the pelvis including uterus, ovaries, adnexal
regions, and pelvic cul-de-sac. It was necessary to proceed with
endovaginal exam following the transabdominal exam to visualize the
uterus, endometrium and ovaries.

[Series 1: us pelvis complete with transvaginal · 0.22mm/px · 13 of 47 slices shown]
[im 1/47]
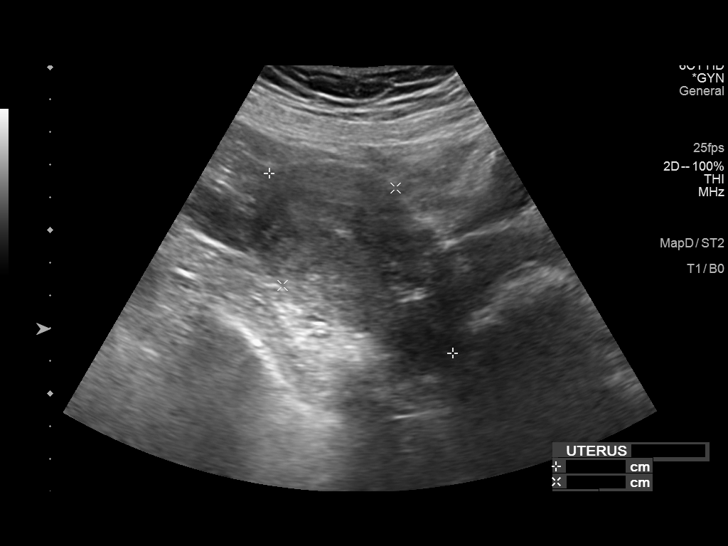
[im 4/47]
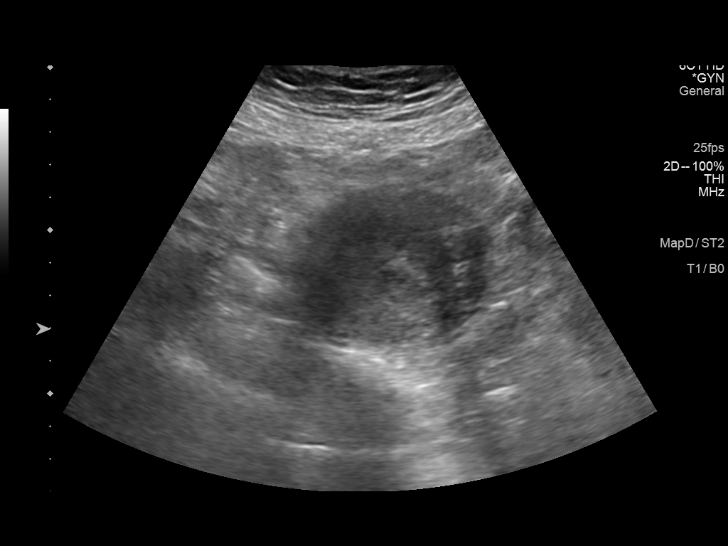
[im 8/47]
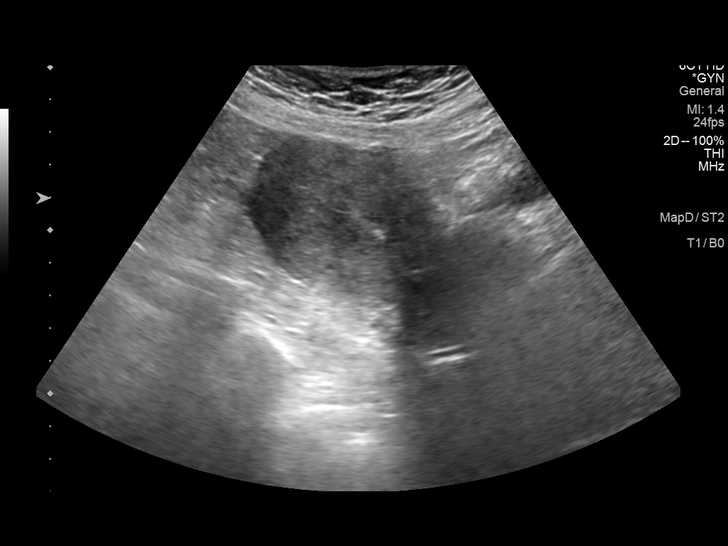
[im 12/47]
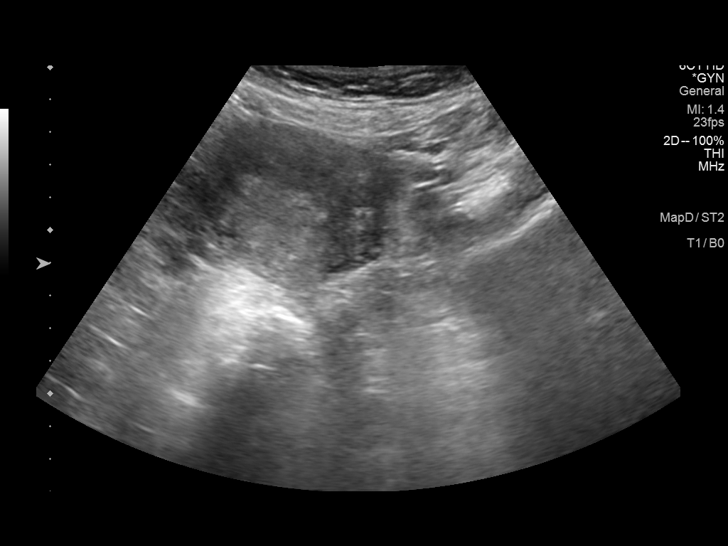
[im 16/47]
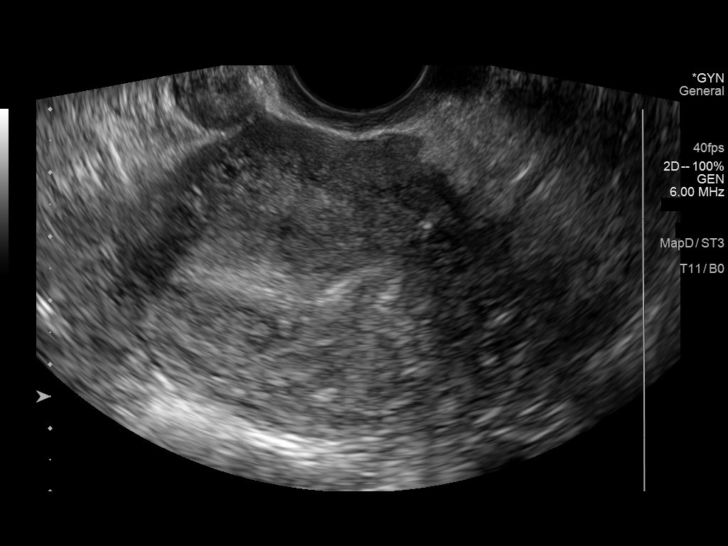
[im 20/47]
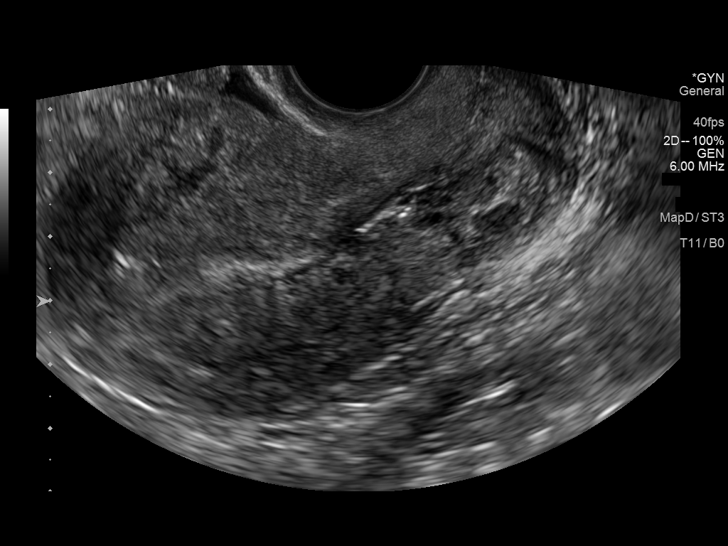
[im 24/47]
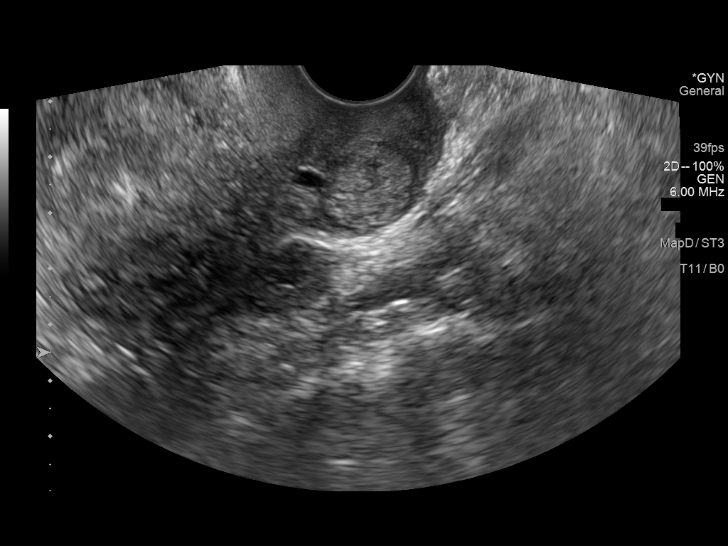
[im 27/47]
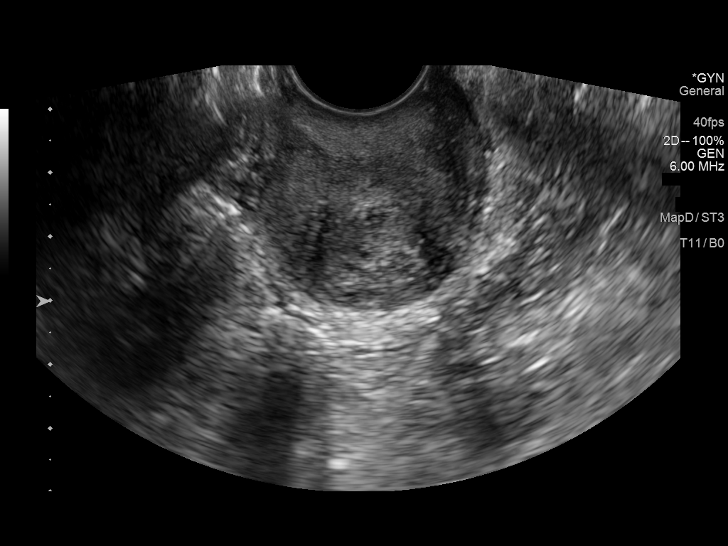
[im 31/47]
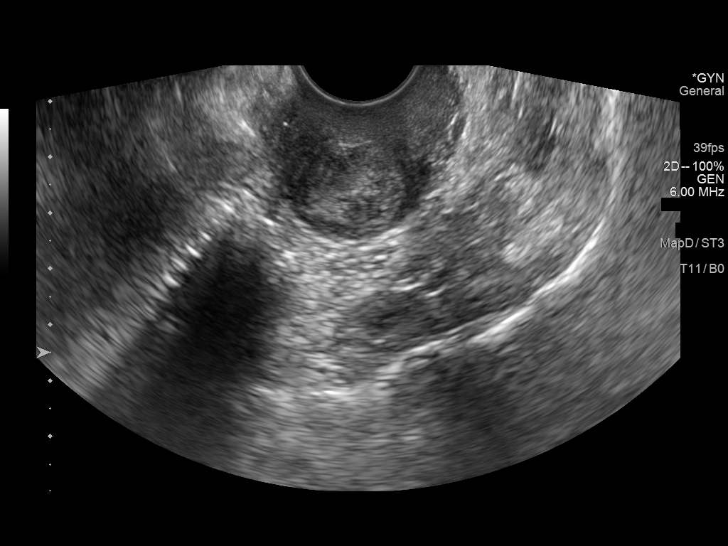
[im 35/47]
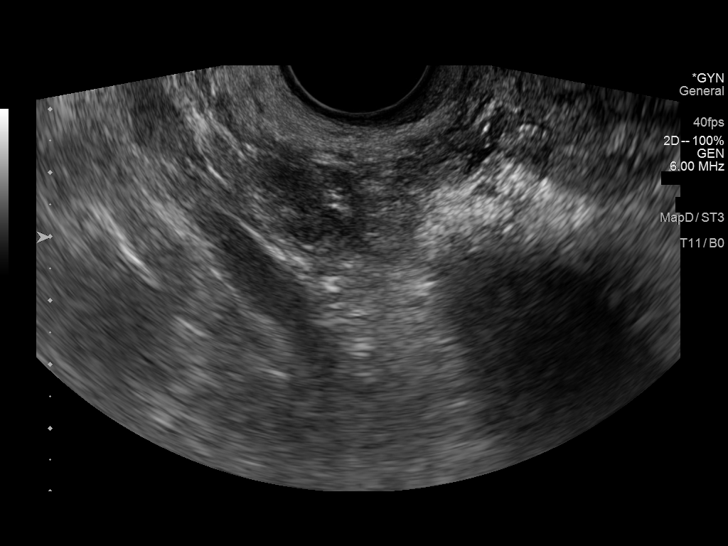
[im 39/47]
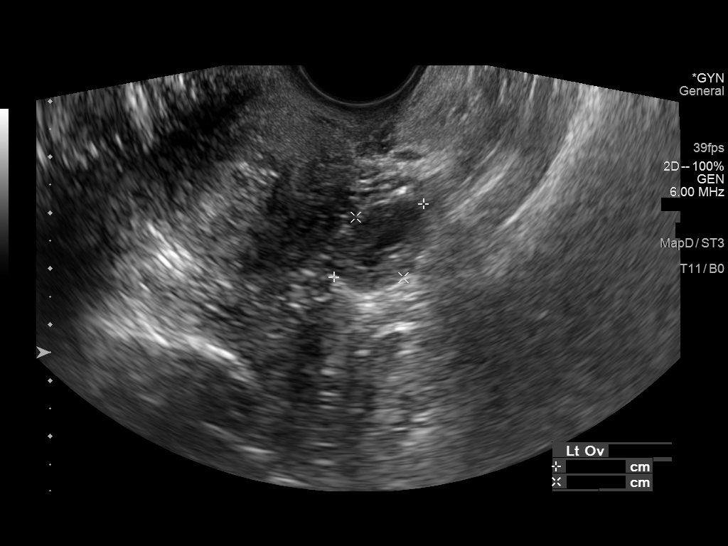
[im 43/47]
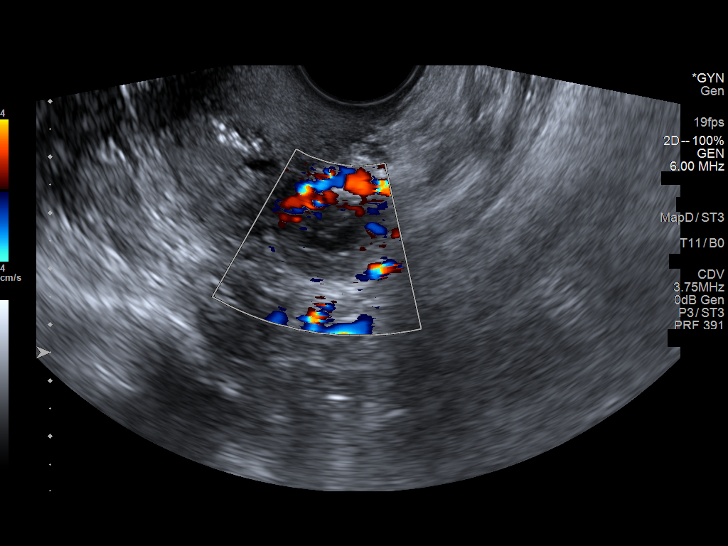
[im 47/47]
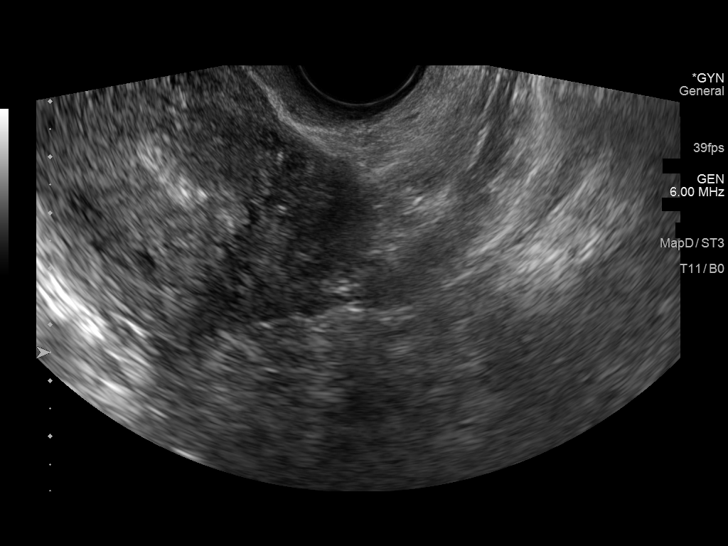

[13 of 25 positions shown; findings below may reference images not displayed]

FINDINGS: Uterus

Measurements: 7.9 x 4.6 x 5.7 cm (volume = 110 cm^3). Within the
posterior wall of the cervix there is a round, well-circumscribed
complex heterogeneous echogenic structure measuring 1.8 x 1.5 x
cm.

Endometrium

Thickness: 8.3 mm.  No focal abnormality visualized.

Right ovary

Measurements: 2.1 x 1.5 x 1.1 cm = volume: 1.9 mL. Normal
appearance/no adnexal mass.

Left ovary

Measurements: 2.1 x 1.5 x 1.9 cm = volume: 2.9 mL. Normal
appearance/no adnexal mass.

Other findings

Trace free fluid noted within the pelvis.
IMPRESSION: 1. Within the posterior wall of the cervix there is a 1.9 cm,
well-circumscribed, round complex echogenic structure. Differential
considerations include complex nabothian cyst, cervical leiomyoma,
polyp, or cervical carcinoma. Recommend more definitive
characterization with contrast enhanced MRI of the pelvis.

## 2022-10-26 ENCOUNTER — Other Ambulatory Visit: Payer: Self-pay | Admitting: Family Medicine

## 2022-10-26 ENCOUNTER — Ambulatory Visit
Admission: RE | Admit: 2022-10-26 | Discharge: 2022-10-26 | Disposition: A | Discharge status: Home / Self Care | Payer: PRIVATE HEALTH INSURANCE | Source: Ambulatory Visit | Attending: Family Medicine | Admitting: Family Medicine

## 2022-10-26 DIAGNOSIS — R059 Cough, unspecified: Secondary | ICD-10-CM

## 2022-10-26 DIAGNOSIS — R0789 Other chest pain: Secondary | ICD-10-CM

## 2022-11-19 ENCOUNTER — Other Ambulatory Visit: Payer: Self-pay | Admitting: Family Medicine

## 2022-11-19 ENCOUNTER — Ambulatory Visit
Admission: RE | Admit: 2022-11-19 | Discharge: 2022-11-19 | Disposition: A | Discharge status: Home / Self Care | Payer: PRIVATE HEALTH INSURANCE | Source: Ambulatory Visit | Attending: Family Medicine | Admitting: Family Medicine

## 2022-11-19 DIAGNOSIS — R0781 Pleurodynia: Secondary | ICD-10-CM

## 2023-02-11 ENCOUNTER — Encounter: Payer: Self-pay | Admitting: Internal Medicine

## 2023-02-11 DIAGNOSIS — N6342 Unspecified lump in left breast, subareolar: Secondary | ICD-10-CM

## 2023-03-11 ENCOUNTER — Encounter: Payer: Self-pay | Admitting: Gastroenterology

## 2023-05-24 ENCOUNTER — Ambulatory Visit (AMBULATORY_SURGERY_CENTER): Payer: PRIVATE HEALTH INSURANCE

## 2023-05-24 VITALS — Ht 59.0 in | Wt 135.0 lb

## 2023-05-24 DIAGNOSIS — E559 Vitamin D deficiency, unspecified: Secondary | ICD-10-CM | POA: Insufficient documentation

## 2023-05-24 DIAGNOSIS — E7800 Pure hypercholesterolemia, unspecified: Secondary | ICD-10-CM | POA: Insufficient documentation

## 2023-05-24 DIAGNOSIS — E538 Deficiency of other specified B group vitamins: Secondary | ICD-10-CM | POA: Insufficient documentation

## 2023-05-24 DIAGNOSIS — Z1211 Encounter for screening for malignant neoplasm of colon: Secondary | ICD-10-CM

## 2023-05-24 MED ORDER — NA SULFATE-K SULFATE-MG SULF 17.5-3.13-1.6 GM/177ML PO SOLN: 1.0000 | Freq: Once | ORAL | 0 refills | Status: AC

## 2023-05-24 NOTE — Progress Notes (Signed)
 No egg or soy allergy known to patient  No issues known to pt with past sedation with any surgeries or procedures Patient denies ever being told they had issues or difficulty with intubation  No FH of Malignant Hyperthermia Pt is not on diet pills Pt is not on  home 02  Pt is not on blood thinners  Pt denies issues with constipation  No A fib or A flutter Have any cardiac testing pending--no  LOA: independent  Prep: Suprep   Patient's chart reviewed by Cathlyn Parsons CNRA prior to previsit and patient appropriate for the [facility].  Previsit completed and red dot placed by patient's name on their procedure day (on provider's schedule).     PV competed with patient. Prep instructions sent via mychart and home address. Goodrx coupon for [facility]  provided to use for price reduction if needed.

## 2023-06-08 ENCOUNTER — Encounter: Payer: Self-pay | Admitting: Gastroenterology

## 2023-06-15 ENCOUNTER — Ambulatory Visit (AMBULATORY_SURGERY_CENTER): Payer: PRIVATE HEALTH INSURANCE | Admitting: Gastroenterology

## 2023-06-15 ENCOUNTER — Encounter: Payer: Self-pay | Admitting: Gastroenterology

## 2023-06-15 VITALS — BP 109/61 | HR 67 | Temp 97.3°F | Resp 11 | Ht 59.0 in | Wt 135.0 lb

## 2023-06-15 DIAGNOSIS — Z1211 Encounter for screening for malignant neoplasm of colon: Secondary | ICD-10-CM | POA: Diagnosis present

## 2023-06-15 MED ORDER — HYDROCORTISONE (PERIANAL) 2.5 % EX CREA: 1.0000 | TOPICAL_CREAM | Freq: Two times a day (BID) | CUTANEOUS | 1 refills | Status: AC

## 2023-06-15 MED ORDER — SODIUM CHLORIDE 0.9 % IV SOLN
500.0000 mL | Freq: Once | INTRAVENOUS | Status: DC
Start: 2023-06-15 — End: 2023-06-15

## 2023-06-15 NOTE — Progress Notes (Unsigned)
Gastroenterology History and Physical   Primary Care Physician:  System, Provider Not In   Reason for Procedure:  Colorectal cancer screening  Plan:    Screening colonoscopy with possible interventions as needed     HPI: Diana Patrick is a very pleasant 52 y.o. female here for screening colonoscopy. Denies any nausea, vomiting, abdominal pain, melena or bright red blood per rectum  The risks and benefits as well as alternatives of endoscopic procedure(s) have been discussed and reviewed. All questions answered. The patient agrees to proceed.    Past Medical History:  Diagnosis Date   Anemia     Past Surgical History:  Procedure Laterality Date   HYSTEROSCOPY N/A 06/25/2021   Procedure: HYDROTHERMAL ABLATION;  Surgeon: Steva Ready, DO;  Location: [facility];  Service: Gynecology;  Laterality: N/A;   HYSTEROSCOPY WITH D & C N/A 06/25/2021   Procedure: DILATATION & CURETTAGE/HYSTEROSCOPY WITH  POLPYECTOMY;  Surgeon: Steva Ready, DO;  Location: [facility];  Service: Gynecology;  Laterality: N/A;    Prior to Admission medications   Medication Sig Start Date End Date Taking? Authorizing Provider  acetaminophen (TYLENOL) 500 MG tablet Take 500 mg by mouth every 6 (six) hours as needed. Pain    [provider]    Current Outpatient Medications  Medication Sig Dispense Refill   acetaminophen (TYLENOL) 500 MG tablet Take 500 mg by mouth every 6 (six) hours as needed. Pain     Current Facility-Administered Medications  Medication Dose Route Frequency Provider Last Rate Last Admin   0.9 %  sodium chloride infusion  500 mL Intravenous Once Napoleon Form, MD        Allergies as of 06/15/2023   (No Known Allergies)    Family History  Problem Relation Age of Onset   COPD Mother    Prostate cancer Father    Colon cancer Neg Hx    Colon polyps Neg Hx    Esophageal cancer Neg Hx    Rectal cancer Neg Hx    Stomach  cancer Neg Hx     Social History   Socioeconomic History   Marital status: Married    Spouse name: Not on file   Number of children: Not on file   Years of education: Not on file   Highest education level: Not on file  Occupational History   Not on file  Tobacco Use   Smoking status: Never   Smokeless tobacco: Never  Vaping Use   Vaping status: Never Used  Substance and Sexual Activity   Alcohol use: No   Drug use: No   Sexual activity: Yes    Birth control/protection: None  Other Topics Concern   Not on file  Social History Narrative   Not on file   Social Determinants of Health   Financial Resource Strain: Not on file  Food Insecurity: Not on file  Transportation Needs: Not on file  Physical Activity: Not on file  Stress: Not on file  Social Connections: Not on file  Intimate Partner Violence: Not on file    Review of Systems:  All other review of systems negative except as mentioned in the HPI.  Physical Exam: Vital signs in last 24 hours: BP (!) 142/92   Pulse 82   Temp (!) 97.3 F (36.3 C)   Ht 4\' 11"  (1.499 m)   Wt 135 lb (61.2 kg)   LMP  (LMP Unknown)   SpO2 98%   BMI 27.27 kg/m  General:   Alert, NAD Lungs:  Clear .   Heart:  Regular rate and rhythm Abdomen:  Soft, nontender and nondistended. Neuro/Psych:  Alert and cooperative. Normal mood and affect. A and O x 3  Reviewed labs, radiology imaging, old records and pertinent past GI work up  Patient is appropriate for planned procedure(s) and anesthesia in an ambulatory setting   K. Scherry Ran , MD [phone]

## 2023-06-15 NOTE — Op Note (Signed)
[facility] Patient Name: Diana Patrick Procedure Date: 06/15/2023 11:59 AM MRN: [id number] Endoscopist: Napoleon Form , MD, [id number] Age: 52 Referring MD:  Date of Birth: [birth year] Gender: Female Account #: [id number] Procedure:                Colonoscopy Indications:              Screening for colorectal malignant neoplasm Medicines:                Monitored Anesthesia Care Procedure:                Pre-Anesthesia Assessment:                           - Prior to the procedure, a History and Physical                            was performed, and patient medications and                            allergies were reviewed. The patient's tolerance of                            previous anesthesia was also reviewed. The risks                            and benefits of the procedure and the sedation                            options and risks were discussed with the patient.                            All questions were answered, and informed consent                            was obtained. Prior Anticoagulants: The patient has                            taken no anticoagulant or antiplatelet agents. ASA                            Grade Assessment: II - A patient with mild systemic                            disease. After reviewing the risks and benefits,                            the patient was deemed in satisfactory condition to                            undergo the procedure.                           After obtaining informed consent, the colonoscope  was passed under direct vision. Throughout the                            procedure, the patient's blood pressure, pulse, and                            oxygen saturations were monitored continuously. The                            Olympus PCF-[id number] (#[id number]) Colonoscope was                            introduced through the anus and advanced to the the                             cecum, identified by appendiceal orifice and                            ileocecal valve. The colonoscopy was performed                            without difficulty. The patient tolerated the                            procedure well. The quality of the bowel                            preparation was good. The ileocecal valve,                            appendiceal orifice, and rectum were photographed. Scope In: 12:05:15 PM Scope Out: 12:19:00 PM Scope Withdrawal Time: 0 hours 8 minutes 4 seconds  Total Procedure Duration: 0 hours 13 minutes 45 seconds  Findings:                 The perianal and digital rectal examinations were                            normal.                           Non-bleeding external and internal hemorrhoids were                            found during retroflexion. The hemorrhoids were                            small.                           The exam was otherwise without abnormality. Complications:            No immediate complications. Estimated Blood Loss:     Estimated blood loss was minimal. Impression:               - Non-bleeding external and internal hemorrhoids.                           -  The examination was otherwise normal.                           - No specimens collected. Recommendation:           - Patient has a contact number available for                            emergencies. The signs and symptoms of potential                            delayed complications were discussed with the                            patient. Return to normal activities tomorrow.                            Written discharge instructions were provided to the                            patient.                           - Resume previous diet.                           - Continue present medications.                           - Repeat colonoscopy in 10 years for surveillance. Napoleon Form, MD 06/15/2023 12:29:43 PM This report has been signed  electronically.

## 2023-06-15 NOTE — Progress Notes (Unsigned)
Pt's states no medical or surgical changes since previsit or office visit. 

## 2023-06-15 NOTE — Progress Notes (Unsigned)
Report to PACU, RN, vss, BBS= Clear.  

## 2023-06-15 NOTE — Patient Instructions (Addendum)
Repeat Colonoscopy in 10 years for screening purposes.   YOU HAD AN ENDOSCOPIC PROCEDURE TODAY AT THE [facility]:   Refer to the procedure report that was given to you for any specific questions about what was found during the examination.  If the procedure report does not answer your questions, please call your gastroenterologist to clarify.  If you requested that your care partner not be given the details of your procedure findings, then the procedure report has been included in a sealed envelope for you to review at your convenience later.  YOU SHOULD EXPECT: Some feelings of bloating in the abdomen. Passage of more gas than usual.  Walking can help get rid of the air that was put into your GI tract during the procedure and reduce the bloating. If you had a lower endoscopy (such as a colonoscopy or flexible sigmoidoscopy) you may notice spotting of blood in your stool or on the toilet paper. If you underwent a bowel prep for your procedure, you may not have a normal bowel movement for a few days.  Please Note:  You might notice some irritation and congestion in your nose or some drainage.  This is from the oxygen used during your procedure.  There is no need for concern and it should clear up in a day or so.  SYMPTOMS TO REPORT IMMEDIATELY:  Following lower endoscopy (colonoscopy or flexible sigmoidoscopy):  Excessive amounts of blood in the stool  Significant tenderness or worsening of abdominal pains  Swelling of the abdomen that is new, acute  Fever of 100F or higher  For urgent or emergent issues, a gastroenterologist can be reached at any hour by calling [phone]. Do not use MyChart messaging for urgent concerns.    DIET:  We do recommend a small meal at first, but then you may proceed to your regular diet.  Drink plenty of fluids but you should avoid alcoholic beverages for 24 hours.  ACTIVITY:  You should plan to take it easy for the rest of today and you should  NOT DRIVE or use heavy machinery until tomorrow (because of the sedation medicines used during the test).    FOLLOW UP: Our staff will call the number listed on your records the next business day following your procedure.  We will call around 7:15- 8:00 am to check on you and address any questions or concerns that you may have regarding the information given to you following your procedure. If we do not reach you, we will leave a message.     If any biopsies were taken you will be contacted by phone or by letter within the next 1-3 weeks.  Please call us at [phone] if you have not heard about the biopsies in 3 weeks.    SIGNATURES/CONFIDENTIALITY: You and/or your care partner have signed paperwork which will be entered into your electronic medical record.  These signatures attest to the fact that that the information above on your After Visit Summary has been reviewed and is understood.  Full responsibility of the confidentiality of this discharge information lies with you and/or your care-partner.

## 2023-06-16 ENCOUNTER — Telehealth: Payer: Self-pay | Admitting: *Deleted

## 2023-06-16 NOTE — Telephone Encounter (Signed)
  Follow up Call-     06/15/2023   10:54 AM  Call back number  Post procedure Call Back phone  # [phone]  Permission to leave phone message Yes     Patient questions:  Do you have a fever, pain , or abdominal swelling? No. Pain Score  0 *  Have you tolerated food without any problems? Yes.    Have you been able to return to your normal activities? Yes.    Do you have any questions about your discharge instructions: Diet   No. Medications  No. Follow up visit  No.  Do you have questions or concerns about your Care? No.  Actions: * If pain score is 4 or above: No action needed, pain <4.

## 2023-09-27 ENCOUNTER — Other Ambulatory Visit: Payer: Self-pay

## 2023-09-28 LAB — SURGICAL PATHOLOGY

## 2023-11-01 DIAGNOSIS — D242 Benign neoplasm of left breast: Secondary | ICD-10-CM | POA: Diagnosis not present

## 2023-11-01 DIAGNOSIS — N6489 Other specified disorders of breast: Secondary | ICD-10-CM | POA: Diagnosis not present

## 2023-11-01 DIAGNOSIS — N62 Hypertrophy of breast: Secondary | ICD-10-CM | POA: Diagnosis not present

## 2023-11-08 DIAGNOSIS — D242 Benign neoplasm of left breast: Secondary | ICD-10-CM | POA: Diagnosis not present

## 2023-11-08 DIAGNOSIS — N6311 Unspecified lump in the right breast, upper outer quadrant: Secondary | ICD-10-CM | POA: Diagnosis not present

## 2023-11-11 DIAGNOSIS — Z23 Encounter for immunization: Secondary | ICD-10-CM | POA: Diagnosis not present

## 2023-11-11 DIAGNOSIS — N6310 Unspecified lump in the right breast, unspecified quadrant: Secondary | ICD-10-CM | POA: Diagnosis not present

## 2023-11-11 DIAGNOSIS — D242 Benign neoplasm of left breast: Secondary | ICD-10-CM | POA: Diagnosis not present

## 2023-11-11 DIAGNOSIS — N6320 Unspecified lump in the left breast, unspecified quadrant: Secondary | ICD-10-CM | POA: Diagnosis not present

## 2023-11-17 DIAGNOSIS — U071 COVID-19: Secondary | ICD-10-CM | POA: Diagnosis not present

## 2023-11-17 DIAGNOSIS — R051 Acute cough: Secondary | ICD-10-CM | POA: Diagnosis not present

## 2023-11-17 DIAGNOSIS — J988 Other specified respiratory disorders: Secondary | ICD-10-CM | POA: Diagnosis not present

## 2023-11-17 DIAGNOSIS — H6991 Unspecified Eustachian tube disorder, right ear: Secondary | ICD-10-CM | POA: Diagnosis not present

## 2024-02-29 DIAGNOSIS — N6342 Unspecified lump in left breast, subareolar: Secondary | ICD-10-CM | POA: Diagnosis not present

## 2024-02-29 DIAGNOSIS — N6311 Unspecified lump in the right breast, upper outer quadrant: Secondary | ICD-10-CM | POA: Diagnosis not present

## 2024-02-29 DIAGNOSIS — N6310 Unspecified lump in the right breast, unspecified quadrant: Secondary | ICD-10-CM | POA: Diagnosis not present

## 2024-03-17 DIAGNOSIS — E7800 Pure hypercholesterolemia, unspecified: Secondary | ICD-10-CM | POA: Diagnosis not present

## 2024-03-17 DIAGNOSIS — N959 Unspecified menopausal and perimenopausal disorder: Secondary | ICD-10-CM | POA: Diagnosis not present

## 2024-03-17 DIAGNOSIS — E559 Vitamin D deficiency, unspecified: Secondary | ICD-10-CM | POA: Diagnosis not present

## 2024-03-17 DIAGNOSIS — D508 Other iron deficiency anemias: Secondary | ICD-10-CM | POA: Diagnosis not present

## 2024-03-17 DIAGNOSIS — E538 Deficiency of other specified B group vitamins: Secondary | ICD-10-CM | POA: Diagnosis not present

## 2024-04-13 DIAGNOSIS — Z01818 Encounter for other preprocedural examination: Secondary | ICD-10-CM | POA: Diagnosis not present

## 2024-04-13 DIAGNOSIS — Z9189 Other specified personal risk factors, not elsewhere classified: Secondary | ICD-10-CM | POA: Diagnosis not present

## 2024-04-13 DIAGNOSIS — R928 Other abnormal and inconclusive findings on diagnostic imaging of breast: Secondary | ICD-10-CM | POA: Diagnosis not present

## 2024-04-13 DIAGNOSIS — D242 Benign neoplasm of left breast: Secondary | ICD-10-CM | POA: Diagnosis not present

## 2024-04-13 DIAGNOSIS — N6320 Unspecified lump in the left breast, unspecified quadrant: Secondary | ICD-10-CM | POA: Diagnosis not present

## 2024-04-13 DIAGNOSIS — N6310 Unspecified lump in the right breast, unspecified quadrant: Secondary | ICD-10-CM | POA: Diagnosis not present

## 2024-06-06 DIAGNOSIS — D4862 Neoplasm of uncertain behavior of left breast: Secondary | ICD-10-CM | POA: Diagnosis not present

## 2024-06-06 DIAGNOSIS — N6082 Other benign mammary dysplasias of left breast: Secondary | ICD-10-CM | POA: Diagnosis not present

## 2024-06-06 DIAGNOSIS — D242 Benign neoplasm of left breast: Secondary | ICD-10-CM | POA: Diagnosis not present

## 2024-06-06 DIAGNOSIS — N6012 Diffuse cystic mastopathy of left breast: Secondary | ICD-10-CM | POA: Diagnosis not present

## 2024-06-06 DIAGNOSIS — N6320 Unspecified lump in the left breast, unspecified quadrant: Secondary | ICD-10-CM | POA: Diagnosis not present

## 2024-07-04 DIAGNOSIS — N6320 Unspecified lump in the left breast, unspecified quadrant: Secondary | ICD-10-CM | POA: Diagnosis not present

## 2024-07-04 DIAGNOSIS — N6325 Unspecified lump in the left breast, overlapping quadrants: Secondary | ICD-10-CM | POA: Diagnosis not present

## 2024-07-04 DIAGNOSIS — N6092 Unspecified benign mammary dysplasia of left breast: Secondary | ICD-10-CM | POA: Diagnosis not present

## 2024-07-04 DIAGNOSIS — R92333 Mammographic heterogeneous density, bilateral breasts: Secondary | ICD-10-CM | POA: Diagnosis not present

## 2024-07-04 DIAGNOSIS — D4862 Neoplasm of uncertain behavior of left breast: Secondary | ICD-10-CM | POA: Diagnosis not present

## 2024-07-04 DIAGNOSIS — R928 Other abnormal and inconclusive findings on diagnostic imaging of breast: Secondary | ICD-10-CM | POA: Diagnosis not present

## 2024-07-20 DIAGNOSIS — Z09 Encounter for follow-up examination after completed treatment for conditions other than malignant neoplasm: Secondary | ICD-10-CM | POA: Diagnosis not present

## 2024-07-20 DIAGNOSIS — Z9189 Other specified personal risk factors, not elsewhere classified: Secondary | ICD-10-CM | POA: Diagnosis not present

## 2024-07-20 DIAGNOSIS — Z9889 Other specified postprocedural states: Secondary | ICD-10-CM | POA: Diagnosis not present

## 2024-07-20 DIAGNOSIS — N6310 Unspecified lump in the right breast, unspecified quadrant: Secondary | ICD-10-CM | POA: Diagnosis not present

## 2024-07-20 DIAGNOSIS — N6320 Unspecified lump in the left breast, unspecified quadrant: Secondary | ICD-10-CM | POA: Diagnosis not present

## 2024-07-20 DIAGNOSIS — D4862 Neoplasm of uncertain behavior of left breast: Secondary | ICD-10-CM | POA: Diagnosis not present

## 2024-09-04 DIAGNOSIS — M9902 Segmental and somatic dysfunction of thoracic region: Secondary | ICD-10-CM | POA: Diagnosis not present

## 2024-09-04 DIAGNOSIS — M9901 Segmental and somatic dysfunction of cervical region: Secondary | ICD-10-CM | POA: Diagnosis not present

## 2024-09-04 DIAGNOSIS — M50122 Cervical disc disorder at C5-C6 level with radiculopathy: Secondary | ICD-10-CM | POA: Diagnosis not present

## 2024-09-05 DIAGNOSIS — M9902 Segmental and somatic dysfunction of thoracic region: Secondary | ICD-10-CM | POA: Diagnosis not present

## 2024-09-05 DIAGNOSIS — M9901 Segmental and somatic dysfunction of cervical region: Secondary | ICD-10-CM | POA: Diagnosis not present

## 2024-09-05 DIAGNOSIS — M50122 Cervical disc disorder at C5-C6 level with radiculopathy: Secondary | ICD-10-CM | POA: Diagnosis not present

## 2024-09-07 DIAGNOSIS — M9902 Segmental and somatic dysfunction of thoracic region: Secondary | ICD-10-CM | POA: Diagnosis not present

## 2024-09-07 DIAGNOSIS — M50122 Cervical disc disorder at C5-C6 level with radiculopathy: Secondary | ICD-10-CM | POA: Diagnosis not present

## 2024-09-07 DIAGNOSIS — M9901 Segmental and somatic dysfunction of cervical region: Secondary | ICD-10-CM | POA: Diagnosis not present

## 2024-09-11 DIAGNOSIS — M9901 Segmental and somatic dysfunction of cervical region: Secondary | ICD-10-CM | POA: Diagnosis not present

## 2024-09-11 DIAGNOSIS — M9902 Segmental and somatic dysfunction of thoracic region: Secondary | ICD-10-CM | POA: Diagnosis not present

## 2024-09-11 DIAGNOSIS — M50122 Cervical disc disorder at C5-C6 level with radiculopathy: Secondary | ICD-10-CM | POA: Diagnosis not present

## 2024-09-12 DIAGNOSIS — M9902 Segmental and somatic dysfunction of thoracic region: Secondary | ICD-10-CM | POA: Diagnosis not present

## 2024-09-12 DIAGNOSIS — M50122 Cervical disc disorder at C5-C6 level with radiculopathy: Secondary | ICD-10-CM | POA: Diagnosis not present

## 2024-09-12 DIAGNOSIS — M9901 Segmental and somatic dysfunction of cervical region: Secondary | ICD-10-CM | POA: Diagnosis not present

## 2024-09-13 DIAGNOSIS — M9902 Segmental and somatic dysfunction of thoracic region: Secondary | ICD-10-CM | POA: Diagnosis not present

## 2024-09-13 DIAGNOSIS — M9901 Segmental and somatic dysfunction of cervical region: Secondary | ICD-10-CM | POA: Diagnosis not present

## 2024-09-13 DIAGNOSIS — M50122 Cervical disc disorder at C5-C6 level with radiculopathy: Secondary | ICD-10-CM | POA: Diagnosis not present

## 2024-09-18 DIAGNOSIS — M9901 Segmental and somatic dysfunction of cervical region: Secondary | ICD-10-CM | POA: Diagnosis not present

## 2024-09-18 DIAGNOSIS — M9902 Segmental and somatic dysfunction of thoracic region: Secondary | ICD-10-CM | POA: Diagnosis not present

## 2024-09-18 DIAGNOSIS — M50122 Cervical disc disorder at C5-C6 level with radiculopathy: Secondary | ICD-10-CM | POA: Diagnosis not present
